# Patient Record
Sex: Female | Born: 1975
Health system: Southern US, Community
[De-identification: ages and names within clinical notes are randomized; demographics above are authoritative.]

## PROBLEM LIST (undated history)

## (undated) DIAGNOSIS — B009 Herpesviral infection, unspecified: Secondary | ICD-10-CM

## (undated) DIAGNOSIS — T7840XA Allergy, unspecified, initial encounter: Secondary | ICD-10-CM

## (undated) DIAGNOSIS — Z789 Other specified health status: Secondary | ICD-10-CM

## (undated) DIAGNOSIS — O209 Hemorrhage in early pregnancy, unspecified: Secondary | ICD-10-CM

## (undated) DIAGNOSIS — O09529 Supervision of elderly multigravida, unspecified trimester: Secondary | ICD-10-CM

## (undated) HISTORY — DX: Hemorrhage in early pregnancy, unspecified: O20.9

## (undated) HISTORY — DX: Supervision of elderly multigravida, unspecified trimester: O09.529

## (undated) HISTORY — PX: OTHER SURGICAL HISTORY: SHX169

## (undated) HISTORY — DX: Allergy, unspecified, initial encounter: T78.40XA

## (undated) HISTORY — DX: Herpesviral infection, unspecified: B00.9

---

## 1999-10-06 ENCOUNTER — Encounter: Admission: RE | Admit: 1999-10-06 | Discharge: 1999-10-06 | Payer: Self-pay | Admitting: Specialist

## 1999-10-06 ENCOUNTER — Encounter: Payer: Self-pay | Admitting: Specialist

## 2001-06-26 ENCOUNTER — Other Ambulatory Visit: Admission: RE | Admit: 2001-06-26 | Discharge: 2001-06-26 | Payer: Self-pay | Admitting: Obstetrics and Gynecology

## 2001-12-24 ENCOUNTER — Inpatient Hospital Stay (HOSPITAL_COMMUNITY): Admission: AD | Admit: 2001-12-24 | Discharge: 2001-12-24 | Payer: Self-pay | Admitting: Obstetrics and Gynecology

## 2001-12-24 ENCOUNTER — Inpatient Hospital Stay (HOSPITAL_COMMUNITY): Admission: AD | Admit: 2001-12-24 | Discharge: 2001-12-26 | Payer: Self-pay | Admitting: Obstetrics and Gynecology

## 2002-07-30 ENCOUNTER — Other Ambulatory Visit: Admission: RE | Admit: 2002-07-30 | Discharge: 2002-07-30 | Payer: Self-pay | Admitting: Obstetrics and Gynecology

## 2003-12-03 ENCOUNTER — Other Ambulatory Visit: Admission: RE | Admit: 2003-12-03 | Discharge: 2003-12-03 | Payer: Self-pay | Admitting: Obstetrics and Gynecology

## 2005-03-14 ENCOUNTER — Other Ambulatory Visit: Admission: RE | Admit: 2005-03-14 | Discharge: 2005-03-14 | Payer: Self-pay | Admitting: Obstetrics and Gynecology

## 2005-12-14 ENCOUNTER — Inpatient Hospital Stay (HOSPITAL_COMMUNITY): Admission: RE | Admit: 2005-12-14 | Discharge: 2005-12-17 | Payer: Self-pay | Admitting: Obstetrics and Gynecology

## 2006-01-29 ENCOUNTER — Other Ambulatory Visit: Admission: RE | Admit: 2006-01-29 | Discharge: 2006-01-29 | Payer: Self-pay | Admitting: Obstetrics and Gynecology

## 2008-10-29 ENCOUNTER — Encounter (INDEPENDENT_AMBULATORY_CARE_PROVIDER_SITE_OTHER): Payer: Self-pay | Admitting: Obstetrics and Gynecology

## 2008-10-29 ENCOUNTER — Ambulatory Visit (HOSPITAL_BASED_OUTPATIENT_CLINIC_OR_DEPARTMENT_OTHER): Admission: RE | Admit: 2008-10-29 | Discharge: 2008-10-29 | Payer: Self-pay | Admitting: Obstetrics and Gynecology

## 2010-08-28 LAB — CBC
HCT: 37.9 % (ref 36.0–46.0)
Hemoglobin: 13.1 g/dL (ref 12.0–15.0)
MCHC: 34.5 g/dL (ref 30.0–36.0)
MCV: 86.5 fL (ref 78.0–100.0)
Platelets: 166 10*3/uL (ref 150–400)
RBC: 4.38 MIL/uL (ref 3.87–5.11)
RDW: 13.1 % (ref 11.5–15.5)
WBC: 5.9 10*3/uL (ref 4.0–10.5)

## 2010-08-28 LAB — POCT PREGNANCY, URINE: Preg Test, Ur: NEGATIVE

## 2010-10-03 NOTE — Op Note (Signed)
[name], Julie Boyd       ACCOUNT NO.:  [id number]   MEDICAL RECORD NO.:  [id number]          PATIENT TYPE:  AMB   LOCATION:  [facility]                         FACILITY:  [facility]   PHYSICIAN:  Crist Fat. Rivard, M.D. DATE OF BIRTH:  Jun 14, 1975   DATE OF PROCEDURE:  10/29/2008  DATE OF DISCHARGE:                               OPERATIVE REPORT   PREOPERATIVE DIAGNOSIS:  Dysfunctional uterine bleeding with endometrial  polyps.   POSTOPERATIVE DIAGNOSIS:  Dysfunctional uterine bleeding with  endometrial polyps.   ANESTHESIA:  IV sedation, Dr. Council Mechanic; and paracervical block, Dr.  Estanislado Pandy.   PROCEDURE:  Hysteroscopy, resection of endometrial polyps, D and C.   SURGEON:  Sandra A. Rivard, M.D.   ASSISTANT:  No assistant.   ESTIMATED BLOOD LOSS:  Minimal.   PROCEDURE:  After being informed of the planned procedure with possible  complications including bleeding, infection and injury to uterus,  informed consent was obtained.  The patient is taken to OR #1, given IV  sedation without complication.  She is placed in a lithotomy position,  prepped and draped in a sterile fashion and her bladder is emptied with  an in-and-out red rubber catheter.  Pelvic exam reveals an anteverted  uterus normal in size and shape and 2 normal adnexa.  A weighted  speculum is inserted, the anterior lip of the cervix is grasped with a  tenaculum forceps, and we are unable to proceed with uterine sounding at  this time.  Using a cervical os finder, we easily find the right the  direction and we are able to easily dilate the cervix using Hegar  dilator until #33.  The uterus is then sounded at 8.5 cm.   Using an operative hysteroscope with perfusion of sorbitol at a maximum  pressure of 70 mmHg, we are able to visualize the entire uterine cavity  with both tubal ostia.  We note 2 fundal polyps on the posterior wall  and 1 posterior wall lower uterine segment polyp; all of these are  easily resected using  resectoscope.  The hysteroscope is then removed.  A sharp curette is used to curette the endometrial cavity to remove the  rest of normal-appearing endometrium.  The hysteroscope is reinserted to  visualize a clean cavity and no active bleeding.   Instruments are then removed.  Instrument and sponge count is complete  x2.  Estimated blood loss is minimal.  A laceration on the cervix due to  the tenaculum forceps is repaired with a figure-of-eight stitch of 3-0  Vicryl.   Water deficit is 200 mL.  The procedure is very well tolerated by the  patient, who is taken to the recovery room and discharged home in a well  and stable condition.   SPECIMEN:  Endometrial polyps and endometrial curettings sent to  pathology.      Crist Fat Rivard, M.D.  Electronically Signed     SAR/MEDQ  D:  10/29/2008  T:  10/29/2008  Job:  [id number]

## 2010-10-06 NOTE — H&P (Signed)
NAMESOMMER, SPICKARD       ACCOUNT NO.:  [id number]   MEDICAL RECORD NO.:  [id number]          PATIENT TYPE:  MAT   LOCATION:  [facility]                          FACILITY:  [facility]   PHYSICIAN:  Janine Limbo, M.D.DATE OF BIRTH:  Mar 27, 1976   DATE OF ADMISSION:  12/14/2005  DATE OF DISCHARGE:                                HISTORY & PHYSICAL   This is a 35 year old gravida 2, para 1-0-0-1 at 65 and 6/7 weeks who  presented with breech presentation of her infant today and had an external  version which was successful.  She is now being admitted for induction of  labor secondary to unstable lie.  The pregnancy has been followed by Dr.  Estanislado Pandy and remarkable for:  1.  Unsure dates.  2.  History of PPD positive which was treated.  3.  History of thrombocytopenia.  4.  Group B strep negative.   ALLERGIES:  ALEVE CAUSES A RASH.   OB HISTORY:  Remarkable for a vaginal delivery in 2003 of a female infant at  [redacted] weeks gestation weighing 8 pounds, 8 ounces with no complications.   PAST MEDICAL HISTORY:  Remarkable for varicella in 1999, history of  tuberculosis PPD positive in 1995 which was treated, and hypoglycemia in  2002.   FAMILY HISTORY:  Remarkable for grandmother with hypertension.  Mother with  TB, brother with seizures, and mother with migraines.   PAST SURGICAL HISTORY:  Negative.   GENETIC HISTORY:  Negative.   SOCIAL HISTORY:  The patient is married to [place] who is involved and  supportive.  She is of the [employer].  She denies any alcohol, tobacco,  or drug use.  She works as an Equities trader.   PRENATAL LABS:  Blood type A positive, sickle cell trait negative, HIV  declined, Pap normal, hepatitis negative, RPR nonreactive, rubella immune,  cystic fibrosis negative.  Platelets 165,000, most recent platelets are  140,000 in July.   HISTORY OF CURRENT PREGNANCY:  The patient entered care at [redacted] weeks  gestation.  She had an ultrasound to confirm date.  She  had an ultrasound  again at 20 weeks for anatomy which was normal.  She had Glucola at 27 weeks  which was normal.  She had a group B strep done at 36 weeks and it was  negative.  Ultrasound at 37 weeks showed transverse presentation with weight  in the 92nd percentile and bilateral hydrocele.   OBJECTIVE:  VITAL SIGNS:  Stable, afebrile.  HEENT:  Within normal limits.  NECK:  Thyroid normal, not enlarged.  CHEST:  Clear to auscultation.  HEART:  Regular rate and rhythm.  ABDOMEN:  Gravid, 38 cm, vertex, Leopold's, EFM shows reactive fetal heart  rate with irregular contractions.  PELVIC:  Cervix is 1 long, -3 in vertex presentation.  EXTREMITIES:  Within normal limits.   IMPRESSION:  1.  Intrauterine pregnancy at 37 and 6/7 weeks.  2.  Breech presentation, now vertex after successful version.  3.  Unstable lie.   PLAN:  Admit to birthing suites for induction of labor per Dr. Stefano Gaul.      Marie L. Williams, C.N.M.  Janine Limbo, M.D.  Electronically Signed    MLW/MEDQ  D:  12/14/2005  T:  12/14/2005  Job:  [id number]

## 2010-10-06 NOTE — H&P (Signed)
Julie Boyd, Julie Boyd                   ACCOUNT NO.:  [id number]   MEDICAL RECORD NO.:  [id number]                   PATIENT TYPE:  INP   LOCATION:  9127                                 [facility]ACILITY:  [facility]   PHYSICIAN:  Hal Morales, M.D.             DATE OF BIRTH:  [birth year]   DATE OF ADMISSION:  12/24/2001  DATE OF DISCHARGE:  12/26/2001                                HISTORY & PHYSICAL   HISTORY:  The patient is a 35 year old married Hispanic female primigravida  at 39-4/7 weeks who presents with regular uterine contractions all day that  have become increasingly stronger.  She had a labor evaluation in maternity  admissions earlier this afternoon with her cervical examination at that time  3 cm, 90% effaced.  She denies leaking, bleeding, headache, nausea,  vomiting, or visual disturbances.  She reports positive fetal movement.  Her  pregnancy has been followed by the [facility] M.D. service and  has been remarkable for history of positive PPD treated for six months,  thrombocytopenia, group B Strep negative.  Her prenatal laboratories were  collected on June 26, 2001.  Hemoglobin 12.3, hematocrit 35.7, platelets  117,000.  Blood type A+.  Antibody negative.  Sickle cell trait negative.  RPR nonreactive.  Rubella immune.  Hepatitis B surface antigen negative.  HIV nonreactive.  Pap smear was within normal limits.  Gonorrhea negative.  Chlamydia negative.  On July 10, 2001 maternal serum alpha fetoprotein  was within normal range.  On Oct 03, 2001 her one hour Glucola was 102 and  her hemoglobin at that time was 10.9.  Culture of the vaginal tract for  group B Strep on November 27, 2001 was negative.   HISTORY OF PRESENT PREGNANCY:  The patient presented for care on June 26, 2001 at approximately [redacted] weeks gestation.  She was noted to have  thrombocytopenia at that visit and plans were made to recheck her CBC  throughout the pregnancy.  Pregnancy  ultrasonography at [redacted] weeks gestation  showed growth consistent with dates by previous ultrasound giving her an [facility]  of December 27, 2001.  A repeat ultrasound was recommended to be done at 26  weeks to view three vessel cord as well as the insertion site.  A repeat CBC  from Oct 03, 2001 showed hemoglobin 11.2 with platelets 148,000.  Pregnancy  ultrasonography at [redacted] weeks gestation showed normal fluid and normal growth.  At 34-1/[redacted] weeks gestation she had +1 blood in clean catch specimen and trace  protein, 3+ blood in a catheterized specimen.  Her urine was sent for  culture.  That culture was negative and there was no pain.  Creatinine was  normal.  Her hematuria was unexplained.  At her visit at 36-1/2 weeks she  had proteinuria as well as a 24-hour urine and [facility] laboratories were  performed at that time, but are unavailable at this point.   OBSTETRIC HISTORY:  She is  a primigravida.   ALLERGIES:  ALEVE which she gets a rash from.   PAST MEDICAL HISTORY:  She reports having had the usual childhood illnesses.  She had a positive PPD in 1996 with prophylaxis for six months.  She has a  history of constipation.  At the age of 7 she was unconscious after a fall,  but reports no other major accidents.   PAST SURGICAL HISTORY:  Negative.   FAMILY HISTORY:  Remarkable for a paternal grandmother with chronic  hypertension, mother with positive PPD in 1996 and treated for six months as  well.  She has a cousin with history of asthma.  Maternal grandfather  deceased from cancer.  Brother with a history of seizures.  Mother with  history of migraines.  Some family members with alcohol use.   GENETIC HISTORY:  Remarkable for patient's first cousin with septal defects.   SOCIAL HISTORY:  The patient is married to the father of the baby.  His name  is Deatra Ina.  They are of the Catholic faith and Hispanic ethnicity.  They  deny any alcohol, tobacco, or illicit drug use with the pregnancy.    PHYSICAL EXAMINATION:  VITAL SIGNS:  Stable.  She is afebrile.  HEENT:  Grossly within normal limits.  CHEST:  Clear to auscultation.  HEART:  Regular rate and rhythm.  ABDOMEN:  Gravid in contour.  Fundal height extending approximately 39 cm  above the pubic symphysis.  Electronic fetal monitoring is remarkable for  reactive, reassuring fetal heart rate with uterine contractions every two to  four minutes.  PELVIC:  Cervical examination is 4-5 cm, completely effaced, vertex -1 with  intact bag of water.  EXTREMITIES:  Within normal limits.   ASSESSMENT:  1. Intrauterine pregnancy at term.  2. Active labor.  3. Group B Strep negative.   PLAN:  1. Admit __________ for consult with Dr. Pennie Rushing.  2. Routine M.D. orders.  3. Anticipate normal spontaneous vaginal birth.     Cam Hai, CNM                        Hal Morales, M.D.    KS/MEDQ  D:  12/24/2001  T:  12/26/2001  Job:  [id number]

## 2010-10-06 NOTE — Op Note (Signed)
[name], Julie Boyd       ACCOUNT NO.:  [id number]   MEDICAL RECORD NO.:  [id number]          PATIENT TYPE:  MAT   LOCATION:  [facility]                          [facility]   PHYSICIAN:  Janine Limbo, M.D.DATE OF BIRTH:  Aug 22, 1975   DATE OF PROCEDURE:  12/14/2005  DATE OF DISCHARGE:                                 OPERATIVE REPORT   PREOPERATIVE DIAGNOSES:  1.  38 weeks' gestation.  2.  Breech presentation.   POSTOPERATIVE DIAGNOSES:  1.  38 weeks' gestation.  2.  Breech presentation.   PROCEDURE:  Successful external version.   OBSTETRICIAN:  Janine Limbo, M.D.   FIRST ASSISTANT:  Wynelle Bourgeois, CNM.   ANESTHESIA:  None.   DISPOSITION:  The patient is a 35 year old female, gravida 2, para 1-0-0-1,  who presents at 31 weeks' gestation.  She had an infant in the  breech  presentation.  She understands the indications for her procedure and she  accepts the risks of possible fetal distress and possible cesarean delivery.   FINDINGS:  An ultrasound was performed.  The patient was found to have a  single intrauterine gestation in a breech presentation.  The fetal head was  in the right upper quadrant.  The amniotic fluid volume was normal.  There  was an anterior placenta that was grade 2.  The  preprocedure nonstress test was reactive.  The patient's blood type is A+.   PROCEDURE:  The patient was given subcutaneous terbutaline.  The nonstress  test was confirmed to be reactive.  An ultrasound was performed, with  findings as mentioned above.  The patient's abdomen was lubricated.  Under  ultrasound surveillance, we then attempted a counterclockwise external  version.  The fetal head was noted to rotate to the right transverse  position.  Several other attempts were made to rotate the fetal head, and we  were able to successfully rotate the fetal head to a vertex presentation.  The cervix was examined and she was noted to be 1 cm dilated, long, and -3  in  station.  A nonstress test was repeated and the nonstress test was noted  to be reactive.   FOLLOW-UP PLANS:  The patient was offered induction of labor and she was  offered discharge to home.  She carefully considered those options and  elected to remain in the hospital for Cytotec preparation and Pitocin  augmentation of labor.      Janine Limbo, M.D.  Electronically Signed     AVS/MEDQ  D:  12/14/2005  T:  12/14/2005  Job:  [id number]

## 2011-07-31 ENCOUNTER — Ambulatory Visit: Payer: Self-pay | Admitting: Obstetrics and Gynecology

## 2011-08-15 ENCOUNTER — Ambulatory Visit (INDEPENDENT_AMBULATORY_CARE_PROVIDER_SITE_OTHER): Payer: Self-pay | Admitting: Obstetrics and Gynecology

## 2011-08-15 DIAGNOSIS — Z01419 Encounter for gynecological examination (general) (routine) without abnormal findings: Secondary | ICD-10-CM

## 2011-09-17 ENCOUNTER — Telehealth: Payer: Self-pay | Admitting: Obstetrics and Gynecology

## 2011-09-17 NOTE — Telephone Encounter (Signed)
Routed to triage 

## 2011-09-18 NOTE — Telephone Encounter (Signed)
This sr pt

## 2011-09-18 NOTE — Telephone Encounter (Signed)
Pt given pap results and directions for Ibuprofen.  600 mg qid during cycle per SR(chart).  ld

## 2012-04-25 ENCOUNTER — Telehealth: Payer: Self-pay

## 2012-04-25 NOTE — Telephone Encounter (Signed)
Pt walked in this am and left a msg for SR regarding her periods being longer than ususal.  Pt contacted and notified that she would need to be seen. appt sch for 05-07-12. Pt agreeable.  ld

## 2012-05-07 ENCOUNTER — Encounter: Payer: Self-pay | Admitting: Obstetrics and Gynecology

## 2012-09-25 LAB — HM PAP SMEAR: HM PAP: NORMAL

## 2013-05-21 NOTE — L&D Delivery Note (Signed)
Delivery Note At 11:30 AM a viable female, as yet unnamed, was delivered via Vaginal, Spontaneous Delivery (Presentation: Left Occiput Anterior).  APGAR: 8, 9; weight .   Placenta status: spontaneous, intact.  Cord: 3 vessels with the following complications: None.  Cord pH: NA  Anesthesia: None  Episiotomy: None Lacerations: None Suture Repair: None Est. Blood Loss (mL): 200  Mom to postpartum.  Baby to Couplet care / Skin to Skin. Family declines circumcision.  Slight softness of uterus persisted during immediate pp recovery.  Firmed well with massage. Methergine 0.2 mg QID x 24 hours initiated.  Julie Boyd, Julie Boyd 11/29/2013, 12:02 PM

## 2013-11-18 LAB — OB RESULTS CONSOLE GBS: GBS: NEGATIVE

## 2013-11-18 LAB — OB RESULTS CONSOLE ANTIBODY SCREEN: Antibody Screen: NEGATIVE

## 2013-11-18 LAB — OB RESULTS CONSOLE GC/CHLAMYDIA
Chlamydia: NEGATIVE
Gonorrhea: NEGATIVE

## 2013-11-18 LAB — OB RESULTS CONSOLE RPR: RPR: NONREACTIVE

## 2013-11-18 LAB — OB RESULTS CONSOLE ABO/RH: RH TYPE: POSITIVE

## 2013-11-18 LAB — OB RESULTS CONSOLE HEPATITIS B SURFACE ANTIGEN: Hepatitis B Surface Ag: NEGATIVE

## 2013-11-18 LAB — OB RESULTS CONSOLE HIV ANTIBODY (ROUTINE TESTING): HIV: NONREACTIVE

## 2013-11-18 LAB — OB RESULTS CONSOLE RUBELLA ANTIBODY, IGM: RUBELLA: IMMUNE

## 2013-11-29 ENCOUNTER — Encounter (HOSPITAL_COMMUNITY): Payer: Self-pay | Admitting: *Deleted

## 2013-11-29 ENCOUNTER — Inpatient Hospital Stay (HOSPITAL_COMMUNITY)
Admission: AD | Admit: 2013-11-29 | Discharge: 2013-11-30 | DRG: 813 | Disposition: A | Discharge status: Home / Self Care | Payer: BC Managed Care – PPO | Source: Ambulatory Visit | Attending: Obstetrics and Gynecology | Admitting: Obstetrics and Gynecology

## 2013-11-29 DIAGNOSIS — O322XX0 Maternal care for transverse and oblique lie, not applicable or unspecified: Secondary | ICD-10-CM | POA: Diagnosis present

## 2013-11-29 DIAGNOSIS — O479 False labor, unspecified: Secondary | ICD-10-CM | POA: Diagnosis present

## 2013-11-29 DIAGNOSIS — O9912 Other diseases of the blood and blood-forming organs and certain disorders involving the immune mechanism complicating childbirth: Principal | ICD-10-CM

## 2013-11-29 DIAGNOSIS — O209 Hemorrhage in early pregnancy, unspecified: Secondary | ICD-10-CM

## 2013-11-29 DIAGNOSIS — D696 Thrombocytopenia, unspecified: Secondary | ICD-10-CM | POA: Diagnosis present

## 2013-11-29 DIAGNOSIS — O09529 Supervision of elderly multigravida, unspecified trimester: Secondary | ICD-10-CM | POA: Diagnosis present

## 2013-11-29 DIAGNOSIS — Z833 Family history of diabetes mellitus: Secondary | ICD-10-CM | POA: Diagnosis not present

## 2013-11-29 DIAGNOSIS — D689 Coagulation defect, unspecified: Secondary | ICD-10-CM | POA: Diagnosis present

## 2013-11-29 HISTORY — DX: Other specified health status: Z78.9

## 2013-11-29 HISTORY — DX: Supervision of elderly multigravida, unspecified trimester: O09.529

## 2013-11-29 HISTORY — DX: Hemorrhage in early pregnancy, unspecified: O20.9

## 2013-11-29 LAB — CBC
HCT: 36.9 % (ref 36.0–46.0)
Hemoglobin: 12.9 g/dL (ref 12.0–15.0)
MCH: 30.4 pg (ref 26.0–34.0)
MCHC: 35 g/dL (ref 30.0–36.0)
MCV: 86.8 fL (ref 78.0–100.0)
Platelets: 115 10*3/uL — ABNORMAL LOW (ref 150–400)
RBC: 4.25 MIL/uL (ref 3.87–5.11)
RDW: 15.1 % (ref 11.5–15.5)
WBC: 15.4 10*3/uL — ABNORMAL HIGH (ref 4.0–10.5)

## 2013-11-29 LAB — TYPE AND SCREEN
ABO/RH(D): A POS
ANTIBODY SCREEN: NEGATIVE

## 2013-11-29 LAB — RPR

## 2013-11-29 LAB — ABO/RH: ABO/RH(D): A POS

## 2013-11-29 MED ORDER — DIPHENHYDRAMINE HCL 50 MG/ML IJ SOLN: 12.5000 mg | INTRAMUSCULAR | Status: DC | PRN

## 2013-11-29 MED ORDER — WITCH HAZEL-GLYCERIN EX PADS: 1.0000 "application " | MEDICATED_PAD | CUTANEOUS | Status: DC | PRN

## 2013-11-29 MED ORDER — ZOLPIDEM TARTRATE 5 MG PO TABS: 5.0000 mg | ORAL_TABLET | Freq: Every evening | ORAL | Status: DC | PRN

## 2013-11-29 MED ORDER — BENZOCAINE-MENTHOL 20-0.5 % EX AERO: 1.0000 "application " | INHALATION_SPRAY | CUTANEOUS | Status: DC | PRN

## 2013-11-29 MED ORDER — SENNOSIDES-DOCUSATE SODIUM 8.6-50 MG PO TABS
2.0000 | ORAL_TABLET | ORAL | Status: DC
  Administered 2013-11-29: 2 via ORAL
  Filled 2013-11-29: qty 2

## 2013-11-29 MED ORDER — ONDANSETRON HCL 4 MG/2ML IJ SOLN: 4.0000 mg | Freq: Four times a day (QID) | INTRAMUSCULAR | Status: DC | PRN

## 2013-11-29 MED ORDER — FLEET ENEMA 7-19 GM/118ML RE ENEM: 1.0000 | ENEMA | RECTAL | Status: DC | PRN

## 2013-11-29 MED ORDER — PHENYLEPHRINE 40 MCG/ML (10ML) SYRINGE FOR IV PUSH (FOR BLOOD PRESSURE SUPPORT)
80.0000 ug | PREFILLED_SYRINGE | INTRAVENOUS | Status: DC | PRN
  Filled 2013-11-29: qty 2
  Filled 2013-11-29: qty 10

## 2013-11-29 MED ORDER — BUTORPHANOL TARTRATE 1 MG/ML IJ SOLN
INTRAMUSCULAR | Status: AC
  Administered 2013-11-29: 1 mg via INTRAVENOUS
  Filled 2013-11-29: qty 1

## 2013-11-29 MED ORDER — ONDANSETRON HCL 4 MG PO TABS: 4.0000 mg | ORAL_TABLET | ORAL | Status: DC | PRN

## 2013-11-29 MED ORDER — OXYCODONE-ACETAMINOPHEN 5-325 MG PO TABS: 1.0000 | ORAL_TABLET | ORAL | Status: DC | PRN

## 2013-11-29 MED ORDER — OXYTOCIN BOLUS FROM INFUSION: 500.0000 mL | INTRAVENOUS | Status: DC

## 2013-11-29 MED ORDER — TETANUS-DIPHTH-ACELL PERTUSSIS 5-2.5-18.5 LF-MCG/0.5 IM SUSP: 0.5000 mL | Freq: Once | INTRAMUSCULAR | Status: DC

## 2013-11-29 MED ORDER — DIPHENHYDRAMINE HCL 25 MG PO CAPS: 25.0000 mg | ORAL_CAPSULE | Freq: Four times a day (QID) | ORAL | Status: DC | PRN

## 2013-11-29 MED ORDER — IBUPROFEN 600 MG PO TABS: 600.0000 mg | ORAL_TABLET | Freq: Four times a day (QID) | ORAL | Status: DC | PRN

## 2013-11-29 MED ORDER — BUTORPHANOL TARTRATE 1 MG/ML IJ SOLN
1.0000 mg | INTRAMUSCULAR | Status: DC | PRN
  Administered 2013-11-29: 1 mg via INTRAVENOUS

## 2013-11-29 MED ORDER — METHYLERGONOVINE MALEATE 0.2 MG PO TABS
0.2000 mg | ORAL_TABLET | Freq: Four times a day (QID) | ORAL | Status: DC
  Administered 2013-11-29 – 2013-11-30 (×4): 0.2 mg via ORAL
  Filled 2013-11-29 (×4): qty 1

## 2013-11-29 MED ORDER — PRENATAL MULTIVITAMIN CH
1.0000 | ORAL_TABLET | Freq: Every day | ORAL | Status: DC
  Administered 2013-11-30: 1 via ORAL
  Filled 2013-11-29 (×2): qty 1

## 2013-11-29 MED ORDER — ONDANSETRON HCL 4 MG/2ML IJ SOLN: 4.0000 mg | INTRAMUSCULAR | Status: DC | PRN

## 2013-11-29 MED ORDER — LACTATED RINGERS IV SOLN: 500.0000 mL | Freq: Once | INTRAVENOUS | Status: DC

## 2013-11-29 MED ORDER — IBUPROFEN 600 MG PO TABS
600.0000 mg | ORAL_TABLET | Freq: Four times a day (QID) | ORAL | Status: DC
  Administered 2013-11-29 – 2013-11-30 (×4): 600 mg via ORAL
  Filled 2013-11-29 (×4): qty 1

## 2013-11-29 MED ORDER — EPHEDRINE 5 MG/ML INJ
10.0000 mg | INTRAVENOUS | Status: DC | PRN
  Filled 2013-11-29: qty 2

## 2013-11-29 MED ORDER — LANOLIN HYDROUS EX OINT: TOPICAL_OINTMENT | CUTANEOUS | Status: DC | PRN

## 2013-11-29 MED ORDER — ACETAMINOPHEN 325 MG PO TABS: 650.0000 mg | ORAL_TABLET | ORAL | Status: DC | PRN

## 2013-11-29 MED ORDER — LIDOCAINE HCL (PF) 1 % IJ SOLN
30.0000 mL | INTRAMUSCULAR | Status: DC | PRN
  Filled 2013-11-29: qty 30

## 2013-11-29 MED ORDER — PHENYLEPHRINE 40 MCG/ML (10ML) SYRINGE FOR IV PUSH (FOR BLOOD PRESSURE SUPPORT)
80.0000 ug | PREFILLED_SYRINGE | INTRAVENOUS | Status: DC | PRN
  Filled 2013-11-29: qty 2

## 2013-11-29 MED ORDER — OXYTOCIN 40 UNITS IN LACTATED RINGERS INFUSION - SIMPLE MED
62.5000 mL/h | INTRAVENOUS | Status: DC
  Administered 2013-11-29: 62.5 mL/h via INTRAVENOUS
  Filled 2013-11-29: qty 1000

## 2013-11-29 MED ORDER — LACTATED RINGERS IV SOLN: 500.0000 mL | INTRAVENOUS | Status: DC | PRN

## 2013-11-29 MED ORDER — DIBUCAINE 1 % RE OINT: 1.0000 "application " | TOPICAL_OINTMENT | RECTAL | Status: DC | PRN

## 2013-11-29 MED ORDER — OXYCODONE-ACETAMINOPHEN 5-325 MG PO TABS
1.0000 | ORAL_TABLET | ORAL | Status: DC | PRN
  Filled 2013-11-29: qty 2

## 2013-11-29 MED ORDER — CITRIC ACID-SODIUM CITRATE 334-500 MG/5ML PO SOLN: 30.0000 mL | ORAL | Status: DC | PRN

## 2013-11-29 MED ORDER — FENTANYL 2.5 MCG/ML BUPIVACAINE 1/10 % EPIDURAL INFUSION (WH - ANES): 14.0000 mL/h | INTRAMUSCULAR | Status: DC | PRN

## 2013-11-29 MED ORDER — LACTATED RINGERS IV SOLN
INTRAVENOUS | Status: DC
  Administered 2013-11-29: 11:00:00 via INTRAVENOUS

## 2013-11-29 MED ORDER — SIMETHICONE 80 MG PO CHEW: 80.0000 mg | CHEWABLE_TABLET | ORAL | Status: DC | PRN

## 2013-11-29 NOTE — Lactation Note (Signed)
This note was copied from the chart of Julie Boyd. Lactation Consultation Note      Initial consult with this experienced breast feeding mom and her baby, now 3 hours old, and [redacted] weeks gestation. Mom had me look at the under side of her right breast, where she has patches of dried poison ivy. Since the area is dry and healing, I told her it would be safe to breast feed on this side. Mom was going to do breast and formula, but when I explained that cluster feeding is natures was of bring in her milk, and how not introducing formula was better for the baby, mom said she will not use formula. I told her i would prefer to use EBM if she felt the baby was hungry, and to please call for lactation if she has any concerns or questions about this. [place] feeding pages in the baby and Me book reievwed with mom, as well as lactation community and   Patient Name: Julie Boyd ZOXWR'UToday's Date: 11/29/2013 Reason for consult: Initial assessment   Maternal Data Formula Feeding for Exclusion: Yes Reason for exclusion: Mother's choice to formula and breast feed on admission Infant to breast within first hour of birth: Yes Has patient been taught Hand Expression?: Yes Does the patient have breastfeeding experience prior to this delivery?: Yes  Feeding Feeding Type: Breast Fed Length of feed: 20 min  LATCH Score/Interventions Latch: Grasps breast easily, tongue down, lips flanged, rhythmical sucking.  Audible Swallowing: None Intervention(s): Skin to skin  Type of Nipple: Everted at rest and after stimulation  Comfort (Breast/Nipple): Soft / non-tender     Hold (Positioning): No assistance needed to correctly position infant at breast.  LATCH Score: 8  Lactation Tools Discussed/Used     Consult Status Consult Status: Follow-up Date: 11/30/13 Follow-up type: In-patient    Julie LevinsLee, Smt. Loder Anne 11/29/2013, 3:00 PM

## 2013-11-29 NOTE — MAU Note (Signed)
Contractions started early this morning, getting stronger.  ? Pinkish fluid.  3rd preg, this has been different. Was 1cm when last checked

## 2013-11-29 NOTE — H&P (Signed)
Julie Boyd is a 38 y.o. female, G3P2002 at 31 weeks, presenting for UCs since 5:30 am, with ? Leaking of pink fluid.  Reports + FM, denies active bleeding.  Cervix was 1 cm and thick at exam this week, with fetus in a vtx/oblique lie on [place].    Patient Active Problem List   Diagnosis Date Noted  . First trimester bleeding 11/29/2013  . Elderly multigravida 11/29/2013  . Thrombocytopenia, unspecified 11/29/2013    History of present pregnancy: Patient entered care at 11 1/7 weeks.   EDC of 12/13/13 was established by LMP and in agreement with [place] at 7 weeks.   Anatomy scan:  19 2/7 weeks, with normal findings and an anterior placenta.   Additional [place] evaluations:   37 weeks, for presentation--vtx/oblique, normal fluid  Significant prenatal events:  Initially declined genetic testing, then elected to have Harmony and AFP (WNL).  Received TDAP at 27 4/7 weeks.  Had sx of carpal tunnel sx, referred to hand specialist.  Had + urine culture of 60K Ecoli and VRE, negative on repeat.  Vit D and TSH were checked during pregnancy and were WNL.  Platelet count was 165 at NOB, 136 in April, 131 in May, and 125 on last check on 11/18/13. Last evaluation:  11/25/13, with cervix 1 cm, thick, vtx/oblique.  OB History   Grav Para Term Preterm Abortions TAB SAB Ect Mult Living   3 2 2       2     2003--[facility], 72 hour labor, 40 weeks, female, 8+7, epidural, delivered with [facility] 2007--[facility], 72 hour labor, 40 weeks, female, 8+15, epidural, delivered with [facility]  Past Medical History  Diagnosis Date  . Medical history non-contributory    Past Surgical History  Procedure Laterality Date  . Polyp removal     Family History: Father DM; Son asthma; MU colon Ca; PA breast CA  Social History:  reports that she has never smoked. She has never used smokeless tobacco. She reports that she does not drink alcohol or use illicit drugs. FOB, Julie Boyd, is involved and supportive.  Patient is self-employed, of  [employer] faith, and is of Hispanic ethnicity.   Prenatal Transfer Tool  Maternal Diabetes: No Genetic Screening: Normal Harmony and AFP Maternal Ultrasounds/Referrals: Normal Fetal Ultrasounds or other Referrals:  None Maternal Substance Abuse:  No Significant Maternal Medications:  None Significant Maternal Lab Results: Lab values include: Group B Strep negative    ROS:  Contractions, ? Leaking, +FM  Allergies  Allergen Reactions  . Aleve [Naproxen] Rash     Dilation: 4 (Fluid noted confirmig ROM) Effacement (%): 80 Station: -1;0 Exam by:: Julie Boyd, CNM Blood pressure 115/72, pulse 67, temperature 98 F (36.7 C), temperature source Oral, resp. rate 20, height 5' 0.5" (1.537 m), weight 180 lb (81.647 kg), last menstrual period 03/08/2013.  Chest clear Heart RRR without murmur Abd gravid, NT, FH 39 Pelvic: As above--leaking clear fluid, vtx low in pelvis, but slightly oblique Ext: WNL  FHR: Category 1 UCs:  q 3 min, moderate  Prenatal labs: ABO, Rh:   A+ Antibody:  Neg Rubella:   Immune RPR:   NR HBsAg:   Neg HIV:   NR GBS: Negative (07/01 0000) Sickle cell/Hgb electrophoresis:  NA Pap:  WNL 09/2012 GC:  Negative at NOB and 11/18/13 Chlamydia:  Negative at NOB at 11/18/13 Genetic screenings:  Normlal Harmony and AFP Glucola:  WNL Other:   Platelets:  165 at NOB, 136 on 4/30, 131 on 5/12,  125 on 7/1       Assessment/Plan: IUP at 38 weeks SROM, early labor GBS negative Hx mild thrombocytopenia Slightly oblique lie  Plan: Admit to Birthing Suite per consult with Julie Boyd Routine [facility] orders Plans epidural, if platelet count allows  Julie Boyd, [name], [place] 11/29/2013, 10:19 AM

## 2013-11-29 NOTE — MAU Note (Signed)
Patient presents with complaint of contractions every 3-5 minutes since 0533 today.

## 2013-11-30 LAB — CBC
HCT: 38 % (ref 36.0–46.0)
Hemoglobin: 13.1 g/dL (ref 12.0–15.0)
MCH: 30.4 pg (ref 26.0–34.0)
MCHC: 34.5 g/dL (ref 30.0–36.0)
MCV: 88.2 fL (ref 78.0–100.0)
Platelets: 101 10*3/uL — ABNORMAL LOW (ref 150–400)
RBC: 4.31 MIL/uL (ref 3.87–5.11)
RDW: 15.2 % (ref 11.5–15.5)
WBC: 13.2 10*3/uL — AB (ref 4.0–10.5)

## 2013-11-30 MED ORDER — OXYCODONE-ACETAMINOPHEN 5-325 MG PO TABS: 1.0000 | ORAL_TABLET | Freq: Four times a day (QID) | ORAL | Status: DC | PRN

## 2013-11-30 NOTE — Progress Notes (Signed)
Ur chart review completed.  

## 2013-11-30 NOTE — Lactation Note (Signed)
This note was copied from the chart of Julie Boyd. Lactation Consultation Note  Mother denies problems or soreness.  LS9. Mom encouraged to feed baby 8-12 times/24 hours and with feeding cues.  Reviewed engorgement care and encouraged mother to call with questions.  Patient Name: Julie Boyd [id number]'XToday's Date: 11/30/2013 Reason for consult: Follow-up assessment   Maternal Data    Feeding Feeding Type: Breast Fed Length of feed: 25 min  LATCH Score/Interventions                      Lactation Tools Discussed/Used     Consult Status Consult Status: Complete    Hardie PulleyBerkelhammer, Ruth Boschen 11/30/2013, 1:28 PM

## 2013-11-30 NOTE — Discharge Instructions (Signed)
Anemia, Nonspecific Anemia is a condition in which the concentration of red blood cells or hemoglobin in the blood is below normal. Hemoglobin is a substance in red blood cells that carries oxygen to the tissues of the body. Anemia results in not enough oxygen reaching these tissues.  CAUSES  Common causes of anemia include:   Excessive bleeding. Bleeding may be internal or external. This includes excessive bleeding from periods (in women) or from the intestine.   Poor nutrition.   Chronic kidney, thyroid, and liver disease.  Bone marrow disorders that decrease red blood cell production.  Cancer and treatments for cancer.  HIV, AIDS, and their treatments.  Spleen problems that increase red blood cell destruction.  Blood disorders.  Excess destruction of red blood cells due to infection, medicines, and autoimmune disorders. SIGNS AND SYMPTOMS   Minor weakness.   Dizziness.   Headache.  Palpitations.   Shortness of breath, especially with exercise.   Paleness.  Cold sensitivity.  Indigestion.  Nausea.  Difficulty sleeping.  Difficulty concentrating. Symptoms may occur suddenly or they may develop slowly.  DIAGNOSIS  Additional blood tests are often needed. These help your health care provider determine the best treatment. Your health care provider will check your stool for blood and look for other causes of blood loss.  TREATMENT  Treatment varies depending on the cause of the anemia. Treatment can include:   Supplements of iron, vitamin B12, or folic acid.   Hormone medicines.   A blood transfusion. This may be needed if blood loss is severe.   Hospitalization. This may be needed if there is significant continual blood loss.   Dietary changes.  Spleen removal. HOME CARE INSTRUCTIONS Keep all follow-up appointments. It often takes many weeks to correct anemia, and having your health care provider check on your condition and your response to  treatment is very important. SEEK IMMEDIATE MEDICAL CARE IF:   You develop extreme weakness, shortness of breath, or chest pain.   You become dizzy or have trouble concentrating.  You develop heavy vaginal bleeding.   You develop a rash.   You have bloody or black, tarry stools.   You faint.   You vomit up blood.   You vomit repeatedly.   You have abdominal pain.  You have a fever or persistent symptoms for more than 2-3 days.   You have a fever and your symptoms suddenly get worse.   You are dehydrated.  MAKE SURE YOU:  Understand these instructions.  Will watch your condition.  Will get help right away if you are not doing well or get worse. Document Released: 06/14/2004 Document Revised: 01/07/2013 Document Reviewed: 10/31/2012 [facility] Patient Information 2015 [place], [place]. This information is not intended to replace advice given to you by your health care provider. Make sure you discuss any questions you have with your health care provider. Contraception Choices Contraception (birth control) is the use of any methods or devices to prevent pregnancy. Below are some methods to help avoid pregnancy. HORMONAL METHODS   Contraceptive implant. This is a thin, plastic tube containing progesterone hormone. It does not contain estrogen hormone. Your health care provider inserts the tube in the inner part of the upper arm. The tube can remain in place for up to 3 years. After 3 years, the implant must be removed. The implant prevents the ovaries from releasing an egg (ovulation), thickens the cervical mucus to prevent sperm from entering the uterus, and thins the lining of the inside of the  uterus.  Progesterone-only injections. These injections are given every 3 months by your health care provider to prevent pregnancy. This synthetic progesterone hormone stops the ovaries from releasing eggs. It also thickens cervical mucus and changes the uterine lining. This makes  it harder for sperm to survive in the uterus.  Birth control pills. These pills contain estrogen and progesterone hormone. They work by preventing the ovaries from releasing eggs (ovulation). They also cause the cervical mucus to thicken, preventing the sperm from entering the uterus. Birth control pills are prescribed by a health care provider.Birth control pills can also be used to treat heavy periods.  Minipill. This type of birth control pill contains only the progesterone hormone. They are taken every day of each month and must be prescribed by your health care provider.  Birth control patch. The patch contains hormones similar to those in birth control pills. It must be changed once a week and is prescribed by a health care provider.  Vaginal ring. The ring contains hormones similar to those in birth control pills. It is left in the vagina for 3 weeks, removed for 1 week, and then a new one is put back in place. The patient must be comfortable inserting and removing the ring from the vagina.A health care provider's prescription is necessary.  Emergency contraception. Emergency contraceptives prevent pregnancy after unprotected sexual intercourse. This pill can be taken right after sex or up to 5 days after unprotected sex. It is most effective the sooner you take the pills after having sexual intercourse. Most emergency contraceptive pills are available without a prescription. Check with your pharmacist. Do not use emergency contraception as your only form of birth control. BARRIER METHODS   Female condom. This is a thin sheath (latex or rubber) that is worn over the penis during sexual intercourse. It can be used with spermicide to increase effectiveness.  Female condom. This is a soft, loose-fitting sheath that is put into the vagina before sexual intercourse.  Diaphragm. This is a soft, latex, dome-shaped barrier that must be fitted by a health care provider. It is inserted into the vagina,  along with a spermicidal jelly. It is inserted before intercourse. The diaphragm should be left in the vagina for 6 to 8 hours after intercourse.  Cervical cap. This is a round, soft, latex or plastic cup that fits over the cervix and must be fitted by a health care provider. The cap can be left in place for up to 48 hours after intercourse.  Sponge. This is a soft, circular piece of polyurethane foam. The sponge has spermicide in it. It is inserted into the vagina after wetting it and before sexual intercourse.  Spermicides. These are chemicals that kill or block sperm from entering the cervix and uterus. They come in the form of creams, jellies, suppositories, foam, or tablets. They do not require a prescription. They are inserted into the vagina with an applicator before having sexual intercourse. The process must be repeated every time you have sexual intercourse. INTRAUTERINE CONTRACEPTION  Intrauterine device (IUD). This is a T-shaped device that is put in a woman's uterus during a menstrual period to prevent pregnancy. There are 2 types:  Copper IUD. This type of IUD is wrapped in copper wire and is placed inside the uterus. Copper makes the uterus and fallopian tubes produce a fluid that kills sperm. It can stay in place for 10 years.  Hormone IUD. This type of IUD contains the hormone progestin (synthetic progesterone). The hormone  thickens the cervical mucus and prevents sperm from entering the uterus, and it also thins the uterine lining to prevent implantation of a fertilized egg. The hormone can weaken or kill the sperm that get into the uterus. It can stay in place for 3-5 years, depending on which type of IUD is used. PERMANENT METHODS OF CONTRACEPTION  Female tubal ligation. This is when the woman's fallopian tubes are surgically sealed, tied, or blocked to prevent the egg from traveling to the uterus.  Hysteroscopic sterilization. This involves placing a small coil or insert into  each fallopian tube. Your doctor uses a technique called hysteroscopy to do the procedure. The device causes scar tissue to form. This results in permanent blockage of the fallopian tubes, so the sperm cannot fertilize the egg. It takes about 3 months after the procedure for the tubes to become blocked. You must use another form of birth control for these 3 months.  Female sterilization. This is when the female has the tubes that carry sperm tied off (vasectomy).This blocks sperm from entering the vagina during sexual intercourse. After the procedure, the man can still ejaculate fluid (semen). NATURAL PLANNING METHODS  Natural family planning. This is not having sexual intercourse or using a barrier method (condom, diaphragm, cervical cap) on days the woman could become pregnant.  Calendar method. This is keeping track of the length of each menstrual cycle and identifying when you are fertile.  Ovulation method. This is avoiding sexual intercourse during ovulation.  Symptothermal method. This is avoiding sexual intercourse during ovulation, using a thermometer and ovulation symptoms.  Post-ovulation method. This is timing sexual intercourse after you have ovulated. Regardless of which type or method of contraception you choose, it is important that you use condoms to protect against the transmission of sexually transmitted infections (STIs). Talk with your health care provider about which form of contraception is most appropriate for you. Document Released: 05/07/2005 Document Revised: 05/12/2013 Document Reviewed: 10/30/2012 [facility] Patient Information 2015 [place]. This information is not intended to replace advice given to you by your health care provider. Make sure you discuss any questions you have with your health care provider. Postpartum Depression and Baby Blues The postpartum period begins right after the birth of a baby. During this time, there is often a great amount of joy and  excitement. It is also a time of many changes in the life of the parents. Regardless of how many times a mother gives birth, each child brings new challenges and dynamics to the family. It is not unusual to have feelings of excitement along with confusing shifts in moods, emotions, and thoughts. All mothers are at risk of developing postpartum depression or the "baby blues." These mood changes can occur right after giving birth, or they may occur many months after giving birth. The baby blues or postpartum depression can be mild or severe. Additionally, postpartum depression can go away rather quickly, or it can be a long-term condition.  CAUSES Raised hormone levels and the rapid drop in those levels are thought to be a main cause of postpartum depression and the baby blues. A number of hormones change during and after pregnancy. Estrogen and progesterone usually decrease right after the delivery of your baby. The levels of thyroid hormone and various cortisol steroids also rapidly drop. Other factors that play a role in these mood changes include major life events and genetics.  RISK FACTORS If you have any of the following risks for the baby blues or postpartum  depression, know what symptoms to watch out for during the postpartum period. Risk factors that may increase the likelihood of getting the baby blues or postpartum depression include:  Having a personal or family history of depression.   Having depression while being pregnant.   Having premenstrual mood issues or mood issues related to oral contraceptives.  Having a lot of life stress.   Having marital conflict.   Lacking a social support network.   Having a baby with special needs.   Having health problems, such as diabetes.  SIGNS AND SYMPTOMS Symptoms of baby blues include:  Brief changes in mood, such as going from extreme happiness to sadness.  Decreased concentration.   Difficulty sleeping.   Crying spells,  tearfulness.   Irritability.   Anxiety.  Symptoms of postpartum depression typically begin within the first month after giving birth. These symptoms include:  Difficulty sleeping or excessive sleepiness.   Marked weight loss.   Agitation.   Feelings of worthlessness.   Lack of interest in activity or food.  Postpartum psychosis is a very serious condition and can be dangerous. Fortunately, it is rare. Displaying any of the following symptoms is cause for immediate medical attention. Symptoms of postpartum psychosis include:   Hallucinations and delusions.   Bizarre or disorganized behavior.   Confusion or disorientation.  DIAGNOSIS  A diagnosis is made by an evaluation of your symptoms. There are no medical or lab tests that lead to a diagnosis, but there are various questionnaires that a health care provider may use to identify those with the baby blues, postpartum depression, or psychosis. Often, a screening tool called the [place] Postnatal Depression Scale is used to diagnose depression in the postpartum period.  TREATMENT The baby blues usually goes away on its own in 1-2 weeks. Social support is often all that is needed. You will be encouraged to get adequate sleep and rest. Occasionally, you may be given medicines to help you sleep.  Postpartum depression requires treatment because it can last several months or longer if it is not treated. Treatment may include individual or group therapy, medicine, or both to address any social, physiological, and psychological factors that may play a role in the depression. Regular exercise, a healthy diet, rest, and social support may also be strongly recommended.  Postpartum psychosis is more serious and needs treatment right away. Hospitalization is often needed. HOME CARE INSTRUCTIONS  Get as much rest as you can. Nap when the baby sleeps.   Exercise regularly. Some women find yoga and walking to be beneficial.   Eat a  balanced and nourishing diet.   Do little things that you enjoy. Have a cup of tea, take a bubble bath, read your favorite magazine, or listen to your favorite music.  Avoid alcohol.   Ask for help with household chores, cooking, grocery shopping, or running errands as needed. Do not try to do everything.   Talk to people close to you about how you are feeling. Get support from your partner, family members, friends, or other new moms.  Try to stay positive in how you think. Think about the things you are grateful for.   Do not spend a lot of time alone.   Only take over-the-counter or prescription medicine as directed by your health care provider.  Keep all your postpartum appointments.   Let your health care provider know if you have any concerns.  SEEK MEDICAL CARE IF: You are having a reaction to or problems with your  medicine. SEEK IMMEDIATE MEDICAL CARE IF:  You have suicidal feelings.   You think you may harm the baby or someone else. MAKE SURE YOU:  Understand these instructions.  Will watch your condition.  Will get help right away if you are not doing well or get worse. Document Released: 02/09/2004 Document Revised: 05/12/2013 Document Reviewed: 02/16/2013 [facility] Patient Information 2015 [place], [place]. This information is not intended to replace advice given to you by your health care provider. Make sure you discuss any questions you have with your health care provider. Postpartum Care After Vaginal Delivery After you deliver your newborn (postpartum period), the usual stay in the hospital is 24-72 hours. If there were problems with your labor or delivery, or if you have other medical problems, you might be in the hospital longer.  While you are in the hospital, you will receive help and instructions on how to care for yourself and your newborn during the postpartum period.  While you are in the hospital:  Be sure to tell your nurses if you have pain or  discomfort, as well as where you feel the pain and what makes the pain worse.  If you had an incision made near your vagina (episiotomy) or if you had some tearing during delivery, the nurses may put ice packs on your episiotomy or tear. The ice packs may help to reduce the pain and swelling.  If you are breastfeeding, you may feel uncomfortable contractions of your uterus for a couple of weeks. This is normal. The contractions help your uterus get back to normal size.  It is normal to have some bleeding after delivery.  For the first 1-3 days after delivery, the flow is red and the amount may be similar to a period.  It is common for the flow to start and stop.  In the first few days, you may pass some small clots. Let your nurses know if you begin to pass large clots or your flow increases.  Do not  flush blood clots down the toilet before having the nurse look at them.  During the next 3-10 days after delivery, your flow should become more watery and pink or brown-tinged in color.  Ten to fourteen days after delivery, your flow should be a small amount of yellowish-white discharge.  The amount of your flow will decrease over the first few weeks after delivery. Your flow may stop in 6-8 weeks. Most women have had their flow stop by 12 weeks after delivery.  You should change your sanitary pads frequently.  Wash your hands thoroughly with soap and water for at least 20 seconds after changing pads, using the toilet, or before holding or feeding your newborn.  You should feel like you need to empty your bladder within the first 6-8 hours after delivery.  In case you become weak, lightheaded, or faint, call your nurse before you get out of bed for the first time and before you take a shower for the first time.  Within the first few days after delivery, your breasts may begin to feel tender and full. This is called engorgement. Breast tenderness usually goes away within 48-72 hours after  engorgement occurs. You may also notice milk leaking from your breasts. If you are not breastfeeding, do not stimulate your breasts. Breast stimulation can make your breasts produce more milk.  Spending as much time as possible with your newborn is very important. During this time, you and your newborn can feel close and get to  know each other. Having your newborn stay in your room (rooming in) will help to strengthen the bond with your newborn. It will give you time to get to know your newborn and become comfortable caring for your newborn.  Your hormones change after delivery. Sometimes the hormone changes can temporarily cause you to feel sad or tearful. These feelings should not last more than a few days. If these feelings last longer than that, you should talk to your caregiver.  If desired, talk to your caregiver about methods of family planning or contraception.  Talk to your caregiver about immunizations. Your caregiver may want you to have the following immunizations before leaving the hospital:  Tetanus, diphtheria, and pertussis (Tdap) or tetanus and diphtheria (Td) immunization. It is very important that you and your family (including grandparents) or others caring for your newborn are up-to-date with the Tdap or Td immunizations. The Tdap or Td immunization can help protect your newborn from getting ill.  Rubella immunization.  Varicella (chickenpox) immunization.  Influenza immunization. You should receive this annual immunization if you did not receive the immunization during your pregnancy. Document Released: 03/04/2007 Document Revised: 01/30/2012 Document Reviewed: 01/02/2012 [facility] Patient Information 2015 [place], [place]. This information is not intended to replace advice given to you by your health care provider. Make sure you discuss any questions you have with your health care provider.

## 2013-11-30 NOTE — Discharge Summary (Signed)
Vaginal Delivery Discharge Summary  Julie Boyd  DOB:    04/12/1976 MRN:    [id number] CSN:    [id number]  Date of admission:                  11/29/13  Date of discharge:                   11/30/13  Procedures this admission:  SVD   Date of Delivery: 11/29/13  Newborn Data:  Live born female  Birth Weight: 7 lb 0.5 oz (3190 g) APGAR: 8, 9  Home with mother. Name: Julie Boyd Circumcision Plan: None  History of Present Illness:  Ms. Julie Boyd is a 38 y.o. female, [phone], who presents at 5846w0d weeks gestation. [facility] She was admitted rupture of membranes. Her pregnancy has been complicated by:  Patient Active Problem List   Diagnosis Date Noted  . First trimester bleeding 11/29/2013  . Elderly multigravida 11/29/2013  . Thrombocytopenia, unspecified 11/29/2013  . Vaginal delivery 11/29/2013     Hospital Course:  The patient was admitted on 11/29/13. Neg GBS. She proceeded to have a vaginal delivery of a healthy infant w/o medication for pain. Her delivery was not complicated, and was attended by Julie BridgemanVicki Boyd, CNM. Patient and baby tolerated the procedure without difficulty, with no laceration(s) noted. Infant status was stable and remained in room with mother.  Mother and infant then had an uncomplicated postpartum course, with breastfeeding going well. Mom's physical exam was WNL, and she was discharged home in stable condition. Contraception plan was condoms/coitus interruptus. She received adequate benefit from po pain medications.   Feeding:  breast  Contraception:  condoms, withdrawal method  Discharge hemoglobin:  Hemoglobin  Date Value Ref Range Status  11/30/2013 13.1  12.0 - 15.0 g/dL Final     HCT  Date Value Ref Range Status  11/30/2013 38.0  36.0 - 46.0 % Final    Discharge Physical Exam:   General:  alert Lochia: appropriate Uterine Fundus: firm Incision: Intact DVT Evaluation: No evidence of DVT seen on physical exam. Negative Homan's sign.  Intrapartum Procedures: spontaneous vaginal delivery Postpartum Procedures: Methergine series x 24 hrs due to slight softness of uterus that persisted in the immediate pp recovery period.    Complications-Operative and Postpartum: none  Discharge Diagnoses: Term Pregnancy-delivered, AMA, Gestational Thrombocytopenia  Discharge Information:  Activity:           pelvic rest Diet:                routine Medications: PNV and Percocet Condition:      stable     Discharge to: home  Follow-up Information   [facility] 6 weeks. (Call sooner with questions or concerns.)    Specialty:  Obstetrics and Gynecology   Contact information:   [address] [place] [id number] [phone]       Sherre ScarletWILLIAMS, Julie Shonk CNM 11/30/2013 9:24 AM

## 2014-03-05 ENCOUNTER — Other Ambulatory Visit: Payer: Self-pay

## 2014-03-22 ENCOUNTER — Encounter (HOSPITAL_COMMUNITY): Payer: Self-pay | Admitting: *Deleted

## 2015-05-05 ENCOUNTER — Ambulatory Visit (INDEPENDENT_AMBULATORY_CARE_PROVIDER_SITE_OTHER): Payer: BLUE CROSS/BLUE SHIELD | Admitting: Physician Assistant

## 2015-05-05 VITALS — BP 110/70 | HR 66 | Temp 98.5°F | Resp 18 | Ht 61.0 in | Wt 160.2 lb

## 2015-05-05 DIAGNOSIS — Z6830 Body mass index (BMI) 30.0-30.9, adult: Secondary | ICD-10-CM

## 2015-05-05 DIAGNOSIS — D696 Thrombocytopenia, unspecified: Secondary | ICD-10-CM

## 2015-05-05 DIAGNOSIS — Z13220 Encounter for screening for lipoid disorders: Secondary | ICD-10-CM | POA: Diagnosis not present

## 2015-05-05 DIAGNOSIS — Z0000 Encounter for general adult medical examination without abnormal findings: Secondary | ICD-10-CM | POA: Diagnosis not present

## 2015-05-05 DIAGNOSIS — Z13228 Encounter for screening for other metabolic disorders: Secondary | ICD-10-CM | POA: Diagnosis not present

## 2015-05-05 DIAGNOSIS — Z1329 Encounter for screening for other suspected endocrine disorder: Secondary | ICD-10-CM

## 2015-05-05 DIAGNOSIS — Z23 Encounter for immunization: Secondary | ICD-10-CM

## 2015-05-05 LAB — CBC WITH DIFFERENTIAL/PLATELET
BASOS ABS: 0.1 10*3/uL (ref 0.0–0.1)
BASOS PCT: 1 % (ref 0–1)
Eosinophils Absolute: 0.1 10*3/uL (ref 0.0–0.7)
Eosinophils Relative: 2 % (ref 0–5)
HCT: 41.7 % (ref 36.0–46.0)
HEMOGLOBIN: 14 g/dL (ref 12.0–15.0)
Lymphocytes Relative: 25 % (ref 12–46)
Lymphs Abs: 1.4 10*3/uL (ref 0.7–4.0)
MCH: 29 pg (ref 26.0–34.0)
MCHC: 33.6 g/dL (ref 30.0–36.0)
MCV: 86.3 fL (ref 78.0–100.0)
MPV: 10.1 fL (ref 8.6–12.4)
Monocytes Absolute: 0.3 10*3/uL (ref 0.1–1.0)
Monocytes Relative: 5 % (ref 3–12)
NEUTROS ABS: 3.8 10*3/uL (ref 1.7–7.7)
Neutrophils Relative %: 67 % (ref 43–77)
Platelets: 172 10*3/uL (ref 150–400)
RBC: 4.83 MIL/uL (ref 3.87–5.11)
RDW: 13.5 % (ref 11.5–15.5)
WBC: 5.7 10*3/uL (ref 4.0–10.5)

## 2015-05-05 LAB — TSH: TSH: 2.33 u[IU]/mL (ref 0.350–4.500)

## 2015-05-05 NOTE — Progress Notes (Signed)
Patient ID: Julie Boyd, female    DOB: 1975/06/22, 39 y.o.   MRN: [id number]  PCP: Esmeralda Arthur, MD  Chief Complaint  Patient presents with  . Annual Exam    W/O pelvic exam    Subjective:   HPI: Presents for annual wellness exam. Had requested Dr. Conley Rolls (her husband has seen her) at the front desk, but that message was not passed to the clinical staff. Breast and pelvic exams with Dr. Estanislado Pandy.  She has no specific problems or concerns today, just thinks that she should have a physical.  Has never had a seasonal flu vaccine, and doesn't want one, thinking that it might make her sick. After education about the seasonal flu vaccine, she decides she would like one.    Patient Active Problem List   Diagnosis Date Noted  . Thrombocytopenia, unspecified (HCC) 11/29/2013    Past Medical History  Diagnosis Date  . Medical history non-contributory   . Allergy   . Elderly multigravida 11/29/2013  . First trimester bleeding 11/29/2013  . Vaginal delivery 11/29/2013     Prior to Admission medications   Medication Sig Start Date End Date Taking? Authorizing Provider  Prenatal Vit-Fe Fumarate-FA (PRENATAL MULTIVITAMIN) TABS tablet Take 1 tablet by mouth daily at 12 noon.   Yes Historical Provider, MD  ibuprofen (ADVIL,MOTRIN) 200 MG tablet Take 200 mg by mouth every 6 (six) hours as needed.    Historical Provider, MD    Allergies  Allergen Reactions  . Aleve [Naproxen] Rash    Pt says she can take Ibuprofen without a problem    Past Surgical History  Procedure Laterality Date  . Polyp removal      uterine polyp    Family History  Problem Relation Age of Onset  . Diabetes Father   . Cancer Maternal Grandfather     Social History   Social History  . Marital Status: Significant Other    Spouse Name: Annita Brod  . Number of Children: 3  . Years of Education: 12+   Occupational History  . 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 Smoking status: Never Smoker   . Smokeless tobacco: Never Used  . Alcohol Use: No  . Drug Use: No  . Sexual Activity:    Partners: Male    Birth Control/ Protection: Coitus interruptus   Other Topics Concern  . None   Social History Narrative   Lives with her long-time boyfriend and their 3 sons.   Born in [place]. Came to the [place] in 1994, at age 59.       Review of Systems  Constitutional: Negative.   HENT: Negative.   Eyes: Negative.   Respiratory: Negative.   Cardiovascular: Negative.   Gastrointestinal: Negative.   Genitourinary: Negative.   Musculoskeletal: Negative.   Skin: Negative.   Neurological: Negative.   Psychiatric/Behavioral: Negative.         Objective:  Physical Exam  Constitutional: She is oriented to person, place, and time. Vital signs are normal. She appears well-developed and well-nourished. She is active and cooperative. No distress.  BP 110/70 mmHg  Pulse 66  Temp(Src) 98.5 F (36.9 C) (Oral)  Resp 18  Ht  (1.549 m)  Wt 160 lb 3.2 oz (72.666 kg)  BMI 30.29 kg/m2  SpO2 98%  LMP 04/15/2015   HENT:  Head: Normocephalic and atraumatic.  Right Ear: Hearing, tyMahala Rommelxternal ear and ear canal normal. No  foreign bodies.  Left Ear: Hearing, tympanic membrane, external ear and ear canal normal. No foreign bodies.  Nose: Nose normal.  Mouth/Throat: Uvula is midline, oropharynx is clear and moist and mucous membranes are normal. No oral lesions. Normal dentition. No dental abscesses or uvula swelling. No oropharyngeal exudate.  Eyes: Conjunctivae, EOM and lids are normal. Pupils are equal, round, and reactive to light. Right eye exhibits no discharge. Left eye exhibits no discharge. No scleral icterus.  Fundoscopic exam:      The right eye shows no arteriolar narrowing, no AV nicking, no exudate, no hemorrhage and no papilledema. The right eye shows red reflex.       The left eye shows no arteriolar narrowing, no AV  nicking, no exudate, no hemorrhage and no papilledema. The left eye shows red reflex.  Neck: Trachea normal, normal range of motion and full passive range of motion without pain. Neck supple. No spinous process tenderness and no muscular tenderness present. No thyroid mass and no thyromegaly present.  Cardiovascular: Normal rate, regular rhythm, normal heart sounds, intact distal pulses and normal pulses.   Pulmonary/Chest: Effort normal and breath sounds normal.  Musculoskeletal: She exhibits no edema or tenderness.       Cervical back: Normal.       Thoracic back: Normal.       Lumbar back: Normal.  Lymphadenopathy:       Head (right side): No tonsillar, no preauricular, no posterior auricular and no occipital adenopathy present.       Head (left side): No tonsillar, no preauricular, no posterior auricular and no occipital adenopathy present.    She has no cervical adenopathy.       Right: No supraclavicular adenopathy present.       Left: No supraclavicular adenopathy present.  Neurological: She is alert and oriented to person, place, and time. She has normal strength and normal reflexes. No cranial nerve deficit. She exhibits normal muscle tone. Coordination and gait normal.  Skin: Skin is warm, dry and intact. No rash noted. She is not diaphoretic. No cyanosis or erythema. Nails show no clubbing.  Psychiatric: She has a normal mood and affect. Her speech is normal and behavior is normal. Judgment and thought content normal.           Assessment & Plan:  1. Annual physical exam Age appropriate anticipatory guidance provided.  2. Need for influenza vaccination - Flu Vaccine QUAD 36+ mos IM  3. Screening for hyperlipidemia - Lipid panel  4. Screening for metabolic disorder - Comprehensive metabolic panel  5. Screening for thyroid disorder - TSH  6. Thrombocytopenia, unspecified (HCC) - CBC with Differential/Platelet   Fernande Brashelle S. Joeph Szatkowski, PA-C Physician  Assistant-Certified [facility]

## 2015-05-05 NOTE — Patient Instructions (Signed)
I will contact you with your lab results as soon as they are available.   If you have not heard from me in 2 weeks, please contact me.  The fastest way to get your results is to register for My Chart (see the instructions on the last page of this printout).  Keeping You Healthy  Get These Tests 1. Blood Pressure- Have your blood pressure checked once a year by your health care Perline Awe.  Normal blood pressure is 120/80. 2. Weight- Have your body mass index (BMI) calculated to screen for obesity.  BMI is measure of body fat based on height and weight.  You can also calculate your own BMI at www.nhlbisupport.com/bmi/. 3. Cholesterol- Have your cholesterol checked every 5 years starting at age 20 then yearly starting at age 45. 4. Chlamydia, HIV, and other sexually transmitted diseases- Get screened every year until age 25, then within three months of each new sexual Julie Boyd. 5. Pap Test - Every 1-5 years; discuss with your health care Julie Boyd. 6. Mammogram- Every 1-2 years starting at age 40--50  Take these medicines  Calcium with Vitamin D-Your body needs 1200 mg of Calcium each day and 800-1000 IU of Vitamin D daily.  Your body can only absorb 500 mg of Calcium at a time so Calcium must be taken in 2 or 3 divided doses throughout the day.  Multivitamin with folic acid- Once daily if it is possible for you to become pregnant.  Get these Immunizations  Gardasil-Series of three doses; prevents HPV related illness such as genital warts and cervical cancer.  Menactra-Single dose; prevents meningitis.  Tetanus shot- Every 10 years.  Flu shot-Every year.  Take these steps 1. Do not smoke-Your healthcare Laura Radilla can help you quit.  For tips on how to quit go to www.smokefree.gov or call 1-800 QUITNOW. 2. Be physically active- Exercise 5 days a week for at least 30 minutes.  If you are not already physically active, start slow and gradually work up to 30 minutes of moderate physical  activity.  Examples of moderate activity include walking briskly, dancing, swimming, bicycling, etc. 3. Breast Cancer- A self breast exam every month is important for early detection of breast cancer.  For more information and instruction on self breast exams, ask your healthcare Julie Boyd or www.womenshealth.gov/faq/breast-self-exam.cfm. 4. Eat a healthy diet- Eat a variety of healthy foods such as fruits, vegetables, whole grains, low fat milk, low fat cheeses, yogurt, lean meats, poultry and fish, beans, nuts, tofu, etc.  For more information go to www. Thenutritionsource.org 5. Drink alcohol in moderation- Limit alcohol intake to one drink or less per day. Never drink and drive. 6. Depression- Your emotional health is as important as your physical health.  If you're feeling down or losing interest in things you normally enjoy please talk to your healthcare Julie Boyd about being screened for depression. 7. Dental visit- Brush and floss your teeth twice daily; visit your dentist twice a year. 8. Eye doctor- Get an eye exam at least every 2 years. 9. Helmet use- Always wear a helmet when riding a bicycle, motorcycle, rollerblading or skateboarding. 10. Safe sex- If you may be exposed to sexually transmitted infections, use a condom. 11. Seat belts- Seat belts can save your live; always wear one. 12. Smoke/Carbon Monoxide detectors- These detectors need to be installed on the appropriate level of your home. Replace batteries at least once a year. 13. Skin cancer- When out in the sun please cover up and use sunscreen 15 SPF   or higher. 14. Violence- If anyone is threatening or hurting you, please tell your healthcare Julie Boyd.        

## 2015-05-06 DIAGNOSIS — Z6830 Body mass index (BMI) 30.0-30.9, adult: Secondary | ICD-10-CM | POA: Insufficient documentation

## 2015-05-06 LAB — COMPREHENSIVE METABOLIC PANEL
ALBUMIN: 4.3 g/dL (ref 3.6–5.1)
ALT: 10 U/L (ref 6–29)
AST: 11 U/L (ref 10–30)
Alkaline Phosphatase: 45 U/L (ref 33–115)
BILIRUBIN TOTAL: 0.6 mg/dL (ref 0.2–1.2)
BUN: 9 mg/dL (ref 7–25)
CO2: 27 mmol/L (ref 20–31)
CREATININE: 0.51 mg/dL (ref 0.50–1.10)
Calcium: 8.9 mg/dL (ref 8.6–10.2)
Chloride: 104 mmol/L (ref 98–110)
GLUCOSE: 75 mg/dL (ref 65–99)
Potassium: 4.2 mmol/L (ref 3.5–5.3)
SODIUM: 137 mmol/L (ref 135–146)
Total Protein: 6.9 g/dL (ref 6.1–8.1)

## 2015-05-06 LAB — LIPID PANEL
Cholesterol: 176 mg/dL (ref 125–200)
HDL: 77 mg/dL (ref >=46)
LDL Cholesterol: 86 mg/dL (ref <130)
Total CHOL/HDL Ratio: 2.3 Ratio (ref <=5.0)
Triglycerides: 66 mg/dL (ref <150)
VLDL: 13 mg/dL (ref <30)

## 2015-07-06 ENCOUNTER — Telehealth: Payer: Self-pay | Admitting: Psychology

## 2015-07-06 NOTE — Telephone Encounter (Signed)
Patient left a VM inquiring about an appointment.  I am not taking patients outside the [facility].  Called her back and left a VM.  Would be happy to help her find someone.

## 2015-07-14 ENCOUNTER — Encounter: Payer: Self-pay | Admitting: Physician Assistant

## 2015-07-14 DIAGNOSIS — M4317 Spondylolisthesis, lumbosacral region: Secondary | ICD-10-CM

## 2015-10-18 DIAGNOSIS — Z01419 Encounter for gynecological examination (general) (routine) without abnormal findings: Secondary | ICD-10-CM | POA: Diagnosis not present

## 2015-10-18 DIAGNOSIS — Z124 Encounter for screening for malignant neoplasm of cervix: Secondary | ICD-10-CM | POA: Diagnosis not present

## 2015-10-18 DIAGNOSIS — Z3040 Encounter for surveillance of contraceptives, unspecified: Secondary | ICD-10-CM | POA: Diagnosis not present

## 2015-10-18 DIAGNOSIS — Z6831 Body mass index (BMI) 31.0-31.9, adult: Secondary | ICD-10-CM | POA: Diagnosis not present

## 2015-10-18 LAB — HM PAP SMEAR: HM Pap smear: NORMAL

## 2015-11-01 ENCOUNTER — Encounter: Payer: Self-pay | Admitting: Physician Assistant

## 2015-11-01 DIAGNOSIS — B009 Herpesviral infection, unspecified: Secondary | ICD-10-CM | POA: Insufficient documentation

## 2015-11-01 DIAGNOSIS — Z9289 Personal history of other medical treatment: Secondary | ICD-10-CM | POA: Insufficient documentation

## 2016-01-12 DIAGNOSIS — L731 Pseudofolliculitis barbae: Secondary | ICD-10-CM | POA: Diagnosis not present

## 2016-02-03 DIAGNOSIS — R351 Nocturia: Secondary | ICD-10-CM | POA: Diagnosis not present

## 2016-02-03 DIAGNOSIS — N8110 Cystocele, unspecified: Secondary | ICD-10-CM | POA: Diagnosis not present

## 2016-02-03 DIAGNOSIS — K121 Other forms of stomatitis: Secondary | ICD-10-CM | POA: Diagnosis not present

## 2016-10-25 DIAGNOSIS — Z01419 Encounter for gynecological examination (general) (routine) without abnormal findings: Secondary | ICD-10-CM | POA: Diagnosis not present

## 2016-10-25 DIAGNOSIS — Z3040 Encounter for surveillance of contraceptives, unspecified: Secondary | ICD-10-CM | POA: Diagnosis not present

## 2016-10-25 DIAGNOSIS — Z6830 Body mass index (BMI) 30.0-30.9, adult: Secondary | ICD-10-CM | POA: Diagnosis not present

## 2016-10-25 DIAGNOSIS — R5383 Other fatigue: Secondary | ICD-10-CM | POA: Diagnosis not present

## 2016-10-25 DIAGNOSIS — R32 Unspecified urinary incontinence: Secondary | ICD-10-CM | POA: Diagnosis not present

## 2016-12-12 ENCOUNTER — Ambulatory Visit (INDEPENDENT_AMBULATORY_CARE_PROVIDER_SITE_OTHER): Payer: BLUE CROSS/BLUE SHIELD | Admitting: Allergy & Immunology

## 2016-12-12 ENCOUNTER — Encounter: Payer: Self-pay | Admitting: Allergy & Immunology

## 2016-12-12 VITALS — BP 92/66 | HR 68 | Temp 98.3°F | Resp 14 | Ht 60.5 in | Wt 160.0 lb

## 2016-12-12 DIAGNOSIS — J3089 Other allergic rhinitis: Secondary | ICD-10-CM

## 2016-12-12 DIAGNOSIS — J302 Other seasonal allergic rhinitis: Secondary | ICD-10-CM | POA: Diagnosis not present

## 2016-12-12 DIAGNOSIS — T781XXD Other adverse food reactions, not elsewhere classified, subsequent encounter: Secondary | ICD-10-CM | POA: Diagnosis not present

## 2016-12-12 MED ORDER — AZELASTINE HCL 0.1 % NA SOLN: 2.0000 | Freq: Two times a day (BID) | NASAL | 5 refills | Status: DC

## 2016-12-12 MED ORDER — LEVOCETIRIZINE DIHYDROCHLORIDE 5 MG PO TABS: 5.0000 mg | ORAL_TABLET | Freq: Every evening | ORAL | 5 refills | Status: DC

## 2016-12-12 MED ORDER — FLUTICASONE PROPIONATE 50 MCG/ACT NA SUSP: 2.0000 | Freq: Every day | NASAL | 5 refills | Status: DC

## 2016-12-12 NOTE — Progress Notes (Signed)
NEW PATIENT  Date of Service/Encounter:  12/12/16  Referring provider: Delsa Bern, MD   Assessment:   Seasonal and perennial allergic rhinitis (trees, weeds, grasses, molds, dust mites and cockroach)  Pollen-food allergy syndrome  Plan/Recommendations:   1. Seasonal and perennial allergic rhinitis - Testing today showed: trees, weeds, grasses, molds, dust mites and cockroach - Avoidance measures provided. - Stop the Allegra and Zyrtec since these are not working.  - Start Dymista two sprays per nostril 1-2 times daily, Xyzal (levocetirizine) '5mg'$  tablet once daily and Singulair (montelukast) '10mg'$  daily  - You can use the Xyzal as needed. - You can use an extra dose of the antihistamine, if needed, for breakthrough symptoms.  - Consider nasal saline rinses 1-2 times daily to remove allergens from the nasal cavities as well as help with mucous clearance (this is especially helpful to do before the nasal sprays are given) - Consider allergy shots as a means of long-term control. - Allergy shots "re-train" the immune system to ignore environmental allergens and decrease the resulting immune response to those allergens (sneezing, itchy watery eyes, runny nose, nasal congestion, etc).   - We can discuss more at the next appointment if the medications are not working for you.  2. Oral allergy syndrome - This is caused by a cross reactivity between the proteins on fruits/vegetables and the pollens.  - The incidence of anaphylaxis is <1% in these patients. - There are case reports that immunotherapy can decrease symptoms of OAS, but this is not an indication for allergen immunotherapy by itself.   3. Return in about 3 months (around 03/14/2017).   Subjective:   Chabely Norby is a 41 y.o. female presenting today for evaluation of  Chief Complaint  Patient presents with  . Allergies  . Nasal Congestion  . Headache    Coralee Rud has a history of the  following: Patient Active Problem List   Diagnosis Date Noted  . Seasonal and perennial allergic rhinitis 12/12/2016  . Pollen-food allergy syndrome, subsequent encounter 12/12/2016  . History of positive PPD 11/01/2015  . HSV infection 11/01/2015  . Spondylolisthesis at L5-S1 level 07/14/2015  . BMI 30.0-30.9,adult 05/06/2015  . Thrombocytopenia, unspecified ([place]) 11/29/2013    History obtained from: chart review and patient.  Coralee Rud was referred by Delsa Bern, MD.     Carmaleta is a 41 y.o. female presenting for an allergy evaluation. Symptoms started around ten years ago. Her husband runs a lawn care business. But she developed sneezing and nasal rhinorrhea. When she is around leaves or dust, which causes her to develop nasal congestion and drainage. Lately this has become a problem indoors as well. She does endorse sinus pressure in the frontal areas of her sinuses as well as pressure.   She has tried cetirizine as well as fexofenadine. The [place] worked for a couple of weeks, but then stopped. She has tried nasal sprays (fluticasone). She has tried some OTC eye drops with minimal relief.   She has never had asthma, although her son does have asthma. She may have had some coughing episodes when she was in her teens or so. She was given something that was orange flavored as a a child in [place]. She denies nighttime coughing as well as daytime symptoms. She does feel wheezy on a couple of occasions. She has never needed steroids or been to the ED or Urgent Care for her symptoms. She eats all foods without a problem.    Otherwise, there is  no history of other atopic diseases, including asthma, drug allergies, food allergies, stinging insect allergies, or urticaria. There is no significant infectious history. Vaccinations are up to date.    Past Medical History: Patient Active Problem List   Diagnosis Date Noted  . Seasonal and perennial allergic rhinitis 12/12/2016  .  Pollen-food allergy syndrome, subsequent encounter 12/12/2016  . History of positive PPD 11/01/2015  . HSV infection 11/01/2015  . Spondylolisthesis at L5-S1 level 07/14/2015  . BMI 30.0-30.9,adult 05/06/2015  . Thrombocytopenia, unspecified ([place]) 11/29/2013    Medication List:   Allergies as of 12/12/2016      Reactions   Sulfa Antibiotics Rash   Aleve [naproxen] Rash   Pt says she can take Ibuprofen without a problem      Medication List       Accurate as of 12/12/16  4:40 PM. Always use your most recent med list.          azelastine 0.1 % nasal spray Commonly known as:  ASTELIN Place 2 sprays into both nostrils 2 (two) times daily. Use in each nostril as directed   chlorhexidine 0.12 % solution Commonly known as:  PERIDEX chlorhexidine gluconate 0.12 % mouthwash   fluticasone 50 MCG/ACT nasal spray Commonly known as:  FLONASE Place 2 sprays into both nostrils daily.   ibuprofen 200 MG tablet Commonly known as:  ADVIL,MOTRIN Take 200 mg by mouth every 6 (six) hours as needed.   levocetirizine 5 MG tablet Commonly known as:  XYZAL Take 1 tablet (5 mg total) by mouth every evening.   MULTIVITAMIN ADULT EXTRA C PO multivitamin   valACYclovir 1000 MG tablet Commonly known as:  VALTREX valacyclovir 1 gram tablet  TAKE 1 TABLET BY MOUTH EVERY 12 HOURS FOR 5 DAYS   Vitamin C 100 MG Chew Vitamin C       Birth History: non-contributory. She has been in the [place] for 25 years.    Developmental History: non-contributory.   Past Surgical History: Past Surgical History:  Procedure Laterality Date  . Polyp removal     uterine polyp     Family History: Family History  Problem Relation Age of Onset  . Diabetes Father   . Cancer Maternal Grandfather   . Asthma Son   . Allergic rhinitis Neg Hx   . Angioedema Neg Hx   . Eczema Neg Hx   . Immunodeficiency Neg Hx   . Urticaria Neg Hx      Social History: Shonica lives at home with her husband as well as three  children (one high school, one middle school, and one 39yo). They live in a 39yo home. There is carpeting throughout the home. They have electric heating and central cooling. There are 16 chickens outdoors. There are no dust mite coverings on the bedding. 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 She has lived in the [place] for 25 years and met her husband here in [place]. Her husband is also from [place].     Review of Systems: a 14-point review of systems is pertinent for what is mentioned in HPI.  Otherwise, all other systems were negative. Constitutional: negative other than that listed in the HPI Eyes: negative other than that listed in the HPI Ears, nose, mouth, throat, and face: negative other than that listed in the HPI Respiratory: negative other than that listed in the HPI Cardiovascular: negative other than that listed in the HPI Gastrointestinal: negative other than  that listed in the HPI Genitourinary: negative other than that listed in the HPI Integument: negative other than that listed in the HPI Hematologic: negative other than that listed in the HPI Musculoskeletal: negative other than that listed in the HPI Neurological: negative other than that listed in the HPI Allergy/Immunologic: negative other than that listed in the HPI    Objective:   Blood pressure 92/66, pulse 68, temperature 98.3 F (36.8 C), temperature source Oral, resp. rate 14, height 5' 0.5" (1.537 m), weight 160 lb (72.6 kg), SpO2 95 %, unknown if currently breastfeeding. Body mass index is 30.73 kg/m.   Physical Exam:  General: Alert, interactive, in no acute distress. Very interactive and pleasant female.  Eyes: No conjunctival injection present on the right, No conjunctival injection present on the left, PERRL bilaterally, No discharge on the right, No discharge on the left and No Horner-Trantas dots present Ears: Right TM pearly gray with  normal light reflex, Left TM pearly gray with normal light reflex, Right TM intact without perforation and Left TM intact without perforation.  Nose/Throat: External nose within normal limits, nasal crease present and septum midline, turbinates markedly edematous with clear discharge, post-pharynx erythematous with cobblestoning in the posterior oropharynx. Tonsils 2+ without exudates Neck: Supple without thyromegaly.  Adenopathy: [place] bilateral anterior cervical lymphadenopathy. and No enlarged lymph nodes appreciated in the occipital, axillary, epitrochlear, inguinal, or popliteal regions. Lungs: Clear to auscultation without wheezing, rhonchi or rales. No increased work of breathing. CV: Normal S1/S2, no murmurs. Capillary refill <2 seconds.  Abdomen: Nondistended, nontender. No guarding or rebound tenderness. Bowel sounds present in all fields and hyperactive  Skin: Warm and dry, without lesions or rashes. Extremities:  No clubbing, cyanosis or edema. Neuro:   Grossly intact. No focal deficits appreciated. Responsive to questions.  Diagnostic studies:     Allergy Studies:   Indoor/Outdoor Percutaneous Adult Environmental Panel: positive to cocklebur, sheep sorrel, birch, American beech, Box elder, [place], [place], [place], pecan pollen, Fusarium, Df mite and Dp mites. Otherwise negative with adequate controls.  Indoor/Outdoor Selected Intradermal Environmental Panel: positive to Grass mix, mold mix #2, mold mix #3 and mite mix. Otherwise negative with adequate controls.     Salvatore Marvel, MD [facility]

## 2016-12-12 NOTE — Patient Instructions (Addendum)
1. Seasonal and perennial allergic rhinitis - Testing today showed: trees, weeds, grasses, molds, dust mites and cockroach - Avoidance measures provided. - Stop the Allegra and Zyrtec since these are not working.  - Start Dymista two sprays per nostril 1-2 times daily, Xyzal (levocetirizine) 5mg  tablet once daily and Singulair (montelukast) 10mg  daily  - You can use the Xyzal as needed. - You can use an extra dose of the antihistamine, if needed, for breakthrough symptoms.  - Consider nasal saline rinses 1-2 times daily to remove allergens from the nasal cavities as well as help with mucous clearance (this is especially helpful to do before the nasal sprays are given) - Consider allergy shots as a means of long-term control. - Allergy shots "re-train" the immune system to ignore environmental allergens and decrease the resulting immune response to those allergens (sneezing, itchy watery eyes, runny nose, nasal congestion, etc).   - We can discuss more at the next appointment if the medications are not working for you.  2. Oral allergy syndrome - This is caused by a cross reactivity between the proteins on fruits/vegetables and the pollens.  - The incidence of anaphylaxis is <1% in these patients.  3. Return in about 3 months (around 03/14/2017).   Please inform us of any Emergency Department visits, hospitalizations, or changes in symptoms. Call us before going to the ED for breathing or allergy symptoms since we might be able to fit you in for a sick visit. Feel free to contact us anytime with any questions, problems, or concerns.  It was a pleasure to meet you today! Happy summer! Good luck with the flock of chickens!   Websites that have reliable patient information: 1. American Academy of Asthma, Allergy, and Immunology: www.aaaai.org 2. Food Allergy Research and Education (FARE): foodallergy.org 3. Mothers of Asthmatics: http://www.asthmacommunitynetwork.org 4. American College of  Allergy, Asthma, and Immunology: www.acaai.org  Reducing Pollen Exposure  The American Academy of Allergy, Asthma and Immunology suggests the following steps to reduce your exposure to pollen during allergy seasons.    1. Do not hang sheets or clothing out to dry; pollen may collect on these items. 2. Do not mow lawns or spend time around freshly cut grass; mowing stirs up pollen. 3. Keep windows closed at night.  Keep car windows closed while driving. 4. Minimize morning activities outdoors, a time when pollen counts are usually at their highest. 5. Stay indoors as much as possible when pollen counts or humidity is high and on windy days when pollen tends to remain in the air longer. 6. Use air conditioning when possible.  Many air conditioners have filters that trap the pollen spores. 7. Use a HEPA room air filter to remove pollen form the indoor air you breathe.  Control of House Dust Mite Allergen    House dust mites play a major role in allergic asthma and rhinitis.  They occur in environments with high humidity wherever human skin, the food for dust mites is found. High levels have been detected in dust obtained from mattresses, pillows, carpets, upholstered furniture, bed covers, clothes and soft toys.  The principal allergen of the house dust mite is found in its feces.  A gram of dust may contain 1,000 mites and 250,000 fecal particles.  Mite antigen is easily measured in the air during house cleaning activities.    1. Encase mattresses, including the box spring, and pillow, in an air tight cover.  Seal the zipper end of the encased mattresses with wide  adhesive tape. 2. Wash the bedding in water of 130 degrees Farenheit weekly.  Avoid cotton comforters/quilts and flannel bedding: the most ideal bed covering is the dacron comforter. 3. Remove all upholstered furniture from the bedroom. 4. Remove carpets, carpet padding, rugs, and non-washable window drapes from the bedroom.  Wash  drapes weekly or use plastic window coverings. 5. Remove all non-washable stuffed toys from the bedroom.  Wash stuffed toys weekly. 6. Have the room cleaned frequently with a vacuum cleaner and a damp dust-mop.  The patient should not be in a room which is being cleaned and should wait 1 hour after cleaning before going into the room. 7. Close and seal all heating outlets in the bedroom.  Otherwise, the room will become filled with dust-laden air.  An electric heater can be used to heat the room. 8. Reduce indoor humidity to less than 50%.  Do not use a humidifier.  Control of Mold Allergen  Mold and fungi can grow on a variety of surfaces provided certain temperature and moisture conditions exist.  Outdoor molds grow on plants, decaying vegetation and soil.  The major outdoor mold, Alternaria and Cladosporium, are found in very high numbers during hot and dry conditions.  Generally, a late Summer - Fall peak is seen for common outdoor fungal spores.  Rain will temporarily lower outdoor mold spore count, but counts rise rapidly when the rainy period ends.  The most important indoor molds are Aspergillus and Penicillium.  Dark, humid and poorly ventilated basements are ideal sites for mold growth.  The next most common sites of mold growth are the bathroom and the kitchen.  Outdoor MicrosoftMold Control 1. Use air conditioning and keep windows closed 2. Avoid exposure to decaying vegetation. 3. Avoid leaf raking. 4. Avoid grain handling. 5. Consider wearing a face mask if working in moldy areas.  Indoor Mold Control 1. Maintain humidity below 50%. 2. Clean washable surfaces with 5% bleach solution. 3. Remove sources e.g. contaminated carpets.  Control of Cockroach Allergen  Cockroach allergen has been identified as an important cause of acute attacks of asthma, especially in urban settings.  There are fifty-five species of cockroach that exist in the [place], however only three, the [place],  [place] and Oriental species produce allergen that can affect patients with Asthma.  Allergens can be obtained from fecal particles, egg casings and secretions from cockroaches.    1. Remove food sources. 2. Reduce access to water. 3. Seal access and entry points. 4. Spray runways with 0.5-1% Diazinon or Chlorpyrifos 5. Blow boric acid power under stoves and refrigerator. 6. Place bait stations (hydramethylnon) at feeding sites.   The oral allergy syndrome (OAS) or pollen-food allergy syndrome (PFAS) is a relatively common form of food allergy, particularly in adults. It typically occurs in people who have pollen allergies when the immune system "sees" proteins on the food that look like proteins on the pollen. This results in the allergy antibody (IgE) binding to the food instead of the pollen. Patients typically report itching and/or mild swelling of the mouth and throat immediately following ingestion of certain uncooked fruits (including nuts) or raw vegetables. Only a very small number of affected individuals experience systemic allergic reactions, such as anaphylaxis which occurs with true food allergies.

## 2017-03-06 ENCOUNTER — Ambulatory Visit: Payer: BLUE CROSS/BLUE SHIELD | Admitting: Allergy & Immunology

## 2017-03-15 ENCOUNTER — Ambulatory Visit (INDEPENDENT_AMBULATORY_CARE_PROVIDER_SITE_OTHER): Payer: BLUE CROSS/BLUE SHIELD | Admitting: Urgent Care

## 2017-03-15 DIAGNOSIS — Z23 Encounter for immunization: Secondary | ICD-10-CM | POA: Diagnosis not present

## 2017-03-19 NOTE — Progress Notes (Signed)
Subjective:    Patient ID: Julie Boyd, female    DOB: [birth year], 41 y.o.   MRN: [id number] Chief Complaint  Patient presents with  . Breast Pain    pain in upper left breast for a week     HPI  Julie Boyd is a 41 yo woman who is here with concerns about a left breast mass. This is my first time meeting this patient. She was last seen at our office 2 yrs ago for a CPE by a colleague.  Prior, she has been receiving her breast and pelvic exams with Dr. Silverio Lay, gynecology at [facility]. No prior mammograms.  No fibrocystic breast prior.   Had pain in left upper breast in March when she was weight-lifting so though she had pulled a muscle. Eventually went away.   1 weeks ago when she was eating, she felt something sharp poke her and after that they pain has been back for the past week.  Since then her chest and left arm has felt heavy and then she noticed pain behind her left scapula as well.  It was constant other than went away when she layed down to sleep at night.  When she is doing the dishes she will have to put her arm down on the sink to support herself.  It does not worsen with activity - she went running she did notice that she did get a little stitch in her side.  She thinks it might be getting better.  Is right-hand dominant.  Initially when sxs started, her left upper arm did feel weak/tired.   No GERD sxs. Normal GI/GU.  She has an appt next week for her annual check-up.  Also seen by Dr. Dellis Anes for seasonal allergies several months ago. No recent sxs, no cough.  She might have had asthma as a kid as one of her child does.  She remembered having a constant cough as a child and she eventually grew out of it.  He had her sotp the allergra and zyrtec and start dymista, xyzal, and singulair.  Past Medical History:  Diagnosis Date  . Allergy   . Elderly multigravida 11/29/2013  . First trimester bleeding 11/29/2013  . Medical history  non-contributory   . Vaginal delivery 11/29/2013   Past Surgical History:  Procedure Laterality Date  . Polyp removal     uterine polyp   Current Outpatient Prescriptions on File Prior to Visit  Medication Sig Dispense Refill  . Multiple Vitamins-Minerals (MULTIVITAMIN ADULT EXTRA C PO) multivitamin    . valACYclovir (VALTREX) 1000 MG tablet valacyclovir 1 gram tablet  TAKE 1 TABLET BY MOUTH EVERY 12 HOURS FOR 5 DAYS     No current facility-administered medications on file prior to visit.    Allergies  Allergen Reactions  . Sulfa Antibiotics Rash  . Aleve [Naproxen] Rash    Pt says she can take Ibuprofen without a problem   Family History  Problem Relation Age of Onset  . Diabetes Father   . Cancer Maternal Grandfather   . Asthma Son   . Allergic rhinitis Neg Hx   . Angioedema Neg Hx   . Eczema Neg Hx   . Immunodeficiency Neg Hx   . Urticaria Neg Hx    Social History   Social History  . Marital status: Significant Other    Spouse name: Annita Brod  . Number of children: 3  . Years of education: 12+   Occupational History  . 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 Smoking status: Never Smoker  . Smokeless tobacco: Never Used  . Alcohol use No  . Drug use: No  . Sexual activity: Yes    Partners: Male    Birth control/ protection: Coitus interruptus   Other Topics Concern  . None   Social History Narrative   Lives with her long-time boyfriend and their 3 sons.   Born in [place]. Came to the [place] in 1994, at age 82.   Depression screen [facility] 2/9 03/21/2017 05/05/2015  Decreased Interest 0 0  Down, Depressed, Hopeless 0 0  PHQ - 2 Score 0 0    Review of Systems See hpi    Objective:   Physical Exam  Constitutional: She is oriented to person, place, and time. She appears well-developed and well-nourished. No distress.  HENT:  Head: Normocephalic and atraumatic.  Right Ear: External ear normal.  Left Ear: External  ear normal.  Eyes: Conjunctivae are normal. No scleral icterus.  Neck: Normal range of motion. Neck supple. No thyromegaly present.  Cardiovascular: Normal rate, regular rhythm, normal heart sounds and intact distal pulses.   Pulmonary/Chest: Effort normal and breath sounds normal. No respiratory distress.  Musculoskeletal: She exhibits no edema.  Lymphadenopathy:    She has no cervical adenopathy.  Neurological: She is alert and oriented to person, place, and time.  Skin: Skin is warm and dry. She is not diaphoretic. No erythema.  Psychiatric: She has a normal mood and affect. Her behavior is normal.         BP 112/64   Pulse 72   Temp 98.2 F (36.8 C)   Resp 16   Ht 5' 0.5" (1.537 m)   Wt 154 lb 3.2 oz (69.9 kg)   LMP 03/14/2017   SpO2 99%   BMI 29.62 kg/m  Predicted peak flow 470; pre-peak flow 400; post-peak flow 490  Rt arm 100/60, Lt arm 94/66  Results for orders placed or performed in visit on 03/21/17  POCT CBC  Result Value Ref Range   WBC 5.2 4.6 - 10.2 K/uL   Lymph, poc 1.5 0.6 - 3.4   POC LYMPH PERCENT 28.9 10 - 50 %L   MID (cbc) 0.3 0 - 0.9   POC MID % 5.7 0 - 12 %M   POC Granulocyte 3.4 2 - 6.9   Granulocyte percent 65.4 37 - 80 %G   RBC 4.69 4.04 - 5.48 M/uL   Hemoglobin 13.7 12.2 - 16.2 g/dL   HCT, POC 47.8 29.5 - 47.9 %   MCV 85.6 80 - 97 fL   MCH, POC 29.3 27 - 31.2 pg   MCHC 34.2 31.8 - 35.4 g/dL   RDW, POC 62.1 %   Platelet Count, POC 192 142 - 424 K/uL   MPV 7.4 0 - 99.8 fL   Dg Chest 2 View  Result Date: 03/21/2017 CLINICAL DATA:  Left upper chest pain, no known injury, initial encounter EXAM: CHEST  2 VIEW COMPARISON:  None. FINDINGS: The heart size and mediastinal contours are within normal limits. Both lungs are clear. The visualized skeletal structures are unremarkable. IMPRESSION: No active cardiopulmonary disease. Electronically Signed   By: Alcide Clever M.D.   On: 03/21/2017 09:28   EKG: Sinus bradycardia, no acute ischemic changes  noted but flipped T waves are present in the medial chest leads. No prior EKG available for comparison.   I have personally reviewed the EKG tracing and agree with  the computer interpretation with the exception of possible anterior ischemia   Assessment & Plan:  Consider h. Pylori 1. Atypical chest pain     Orders Placed This Encounter  Procedures  . DG Chest 2 View    Standing Status:   Future    Number of Occurrences:   1    Standing Expiration Date:   03/21/2018    Order Specific Question:   Reason for Exam (SYMPTOM  OR DIAGNOSIS REQUIRED)    Answer:   left upper chest pain, kni, radiating to upper arm and back some    Order Specific Question:   Is the patient pregnant?    Answer:   No    Order Specific Question:   Preferred imaging location?    Answer:   External  . Care order/instruction:    Scheduling Instructions:     Peak Flow (IF NEB IS ORDERED PLEASE DO BEFORE AND AFTER NEB)  . Care order/instruction:    Scheduling Instructions:     Recheck BP in both arms  . POCT CBC  . EKG 12-Lead     Norberto SorensonEva Shaw, M.D.  Primary Care at [facility]  [facility] [address] [place], [place] [address] [phone] phone [phone] fax  03/23/17 5:33 PM

## 2017-03-21 ENCOUNTER — Ambulatory Visit (INDEPENDENT_AMBULATORY_CARE_PROVIDER_SITE_OTHER): Payer: BLUE CROSS/BLUE SHIELD

## 2017-03-21 ENCOUNTER — Encounter: Payer: Self-pay | Admitting: Family Medicine

## 2017-03-21 ENCOUNTER — Ambulatory Visit (INDEPENDENT_AMBULATORY_CARE_PROVIDER_SITE_OTHER): Payer: BLUE CROSS/BLUE SHIELD | Admitting: Family Medicine

## 2017-03-21 VITALS — BP 94/66 | HR 72 | Temp 98.2°F | Resp 16 | Ht 60.5 in | Wt 154.2 lb

## 2017-03-21 DIAGNOSIS — R0789 Other chest pain: Secondary | ICD-10-CM | POA: Diagnosis not present

## 2017-03-21 DIAGNOSIS — R079 Chest pain, unspecified: Secondary | ICD-10-CM | POA: Diagnosis not present

## 2017-03-21 DIAGNOSIS — R9431 Abnormal electrocardiogram [ECG] [EKG]: Secondary | ICD-10-CM

## 2017-03-21 LAB — POCT CBC
Granulocyte percent: 65.4 %G (ref 37–80)
HCT, POC: 40.2 % (ref 37.7–47.9)
Hemoglobin: 13.7 g/dL (ref 12.2–16.2)
Lymph, poc: 1.5 (ref 0.6–3.4)
MCH: 29.3 pg (ref 27–31.2)
MCHC: 34.2 g/dL (ref 31.8–35.4)
MCV: 85.6 fL (ref 80–97)
MID (CBC): 0.3 (ref 0–0.9)
MPV: 7.4 fL (ref 0–99.8)
POC Granulocyte: 3.4 (ref 2–6.9)
POC LYMPH PERCENT: 28.9 %L (ref 10–50)
POC MID %: 5.7 % (ref 0–12)
Platelet Count, POC: 192 10*3/uL (ref 142–424)
RBC: 4.69 M/uL (ref 4.04–5.48)
RDW, POC: 12.9 %
WBC: 5.2 10*3/uL (ref 4.6–10.2)

## 2017-03-21 NOTE — Patient Instructions (Addendum)
   IF you received an x-ray today, you will receive an invoice from [facility]. Please contact [facility] at [phone] with questions or concerns regarding your invoice.   IF you received labwork today, you will receive an invoice from [facility]. Please contact [facility] at [phone] with questions or concerns regarding your invoice.   Our billing staff will not be able to assist you with questions regarding bills from these companies.  You will be contacted with the lab results as soon as they are available. The fastest way to get your results is to activate your My Chart account. Instructions are located on the last page of this paperwork. If you have not heard from us regarding the results in 2 weeks, please contact this office.     Nonspecific Chest Pain Chest pain can be caused by many different conditions. There is always a chance that your pain could be related to something serious, such as a heart attack or a blood clot in your lungs. Chest pain can also be caused by conditions that are not life-threatening. If you have chest pain, it is very important to follow up with your health care provider. What are the causes? Causes of this condition include:  Heartburn.  Pneumonia or bronchitis.  Anxiety or stress.  Inflammation around your heart (pericarditis) or lung (pleuritis or pleurisy).  A blood clot in your lung.  A collapsed lung (pneumothorax). This can develop suddenly on its own (spontaneous pneumothorax) or from trauma to the chest.  Shingles infection (varicella-zoster virus).  Heart attack.  Damage to the bones, muscles, and cartilage that make up your chest wall. This can include: ? Bruised bones due to injury. ? Strained muscles or cartilage due to frequent or repeated coughing or overwork. ? Fracture to one or more ribs. ? Sore cartilage due to inflammation (costochondritis).  What increases the risk? Risk factors for this condition  may include:  Activities that increase your risk for trauma or injury to your chest.  Respiratory infections or conditions that cause frequent coughing.  Medical conditions or overeating that can cause heartburn.  Heart disease or family history of heart disease.  Conditions or health behaviors that increase your risk of developing a blood clot.  Having had chicken pox (varicella zoster).  What are the signs or symptoms? Chest pain can feel like:  Burning or tingling on the surface of your chest or deep in your chest.  Crushing, pressure, aching, or squeezing pain.  Dull or sharp pain that is worse when you move, cough, or take a deep breath.  Pain that is also felt in your back, neck, shoulder, or arm, or pain that spreads to any of these areas.  Your chest pain may come and go, or it may stay constant. How is this diagnosed? Lab tests or other studies may be needed to find the cause of your pain. Your health care provider may have you take a test called an ECG (electrocardiogram). An ECG records your heartbeat patterns at the time the test is performed. You may also have other tests, such as:  Transthoracic echocardiogram (TTE). In this test, sound waves are used to create a picture of the heart structures and to look at how blood flows through your heart.  Transesophageal echocardiogram (TEE).This is a more advanced imaging test that takes images from inside your body. It allows your health care provider to see your heart in finer detail.  Cardiac monitoring. This allows your health care provider to   monitor your heart rate and rhythm in real time.  Holter monitor. This is a portable device that records your heartbeat and can help to diagnose abnormal heartbeats. It allows your health care provider to track your heart activity for several days, if needed.  Stress tests. These can be done through exercise or by taking medicine that makes your heart beat more quickly.  Blood  tests.  Other imaging tests.  How is this treated? Treatment depends on what is causing your chest pain. Treatment may include:  Medicines. These may include: ? Acid blockers for heartburn. ? Anti-inflammatory medicine. ? Pain medicine for inflammatory conditions. ? Antibiotic medicine, if an infection is present. ? Medicines to dissolve blood clots. ? Medicines to treat coronary artery disease (CAD).  Supportive care for conditions that do not require medicines. This may include: ? Resting. ? Applying heat or cold packs to injured areas. ? Limiting activities until pain decreases.  Follow these instructions at home: Medicines  If you were prescribed an antibiotic, take it as told by your health care provider. Do not stop taking the antibiotic even if you start to feel better.  Take over-the-counter and prescription medicines only as told by your health care provider. Lifestyle  Do not use any products that contain nicotine or tobacco, such as cigarettes and e-cigarettes. If you need help quitting, ask your health care provider.  Do not drink alcohol.  Make lifestyle changes as directed by your health care provider. These may include: ? Getting regular exercise. Ask your health care provider to suggest some activities that are safe for you. ? Eating a heart-healthy diet. A registered dietitian can help you to learn healthy eating options. ? Maintaining a healthy weight. ? Managing diabetes, if necessary. ? Reducing stress, such as with yoga or relaxation techniques. General instructions  Avoid any activities that bring on chest pain.  If heartburn is the cause for your chest pain, raise (elevate) the head of your bed about 6 inches (15 cm) by putting blocks under the legs. Sleeping with more pillows does not effectively relieve heartburn because it only changes the position of your head.  Keep all follow-up visits as told by your health care provider. This is important.  This includes any further testing if your chest pain does not go away. Contact a health care provider if:  Your chest pain does not go away.  You have a rash with blisters on your chest.  You have a fever.  You have chills. Get help right away if:  Your chest pain is worse.  You have a cough that gets worse, or you cough up blood.  You have severe pain in your abdomen.  You have severe weakness.  You faint.  You have sudden, unexplained chest discomfort.  You have sudden, unexplained discomfort in your arms, back, neck, or jaw.  You have shortness of breath at any time.  You suddenly start to sweat, or your skin gets clammy.  You feel nauseous or you vomit.  You suddenly feel light-headed or dizzy.  Your heart begins to beat quickly, or it feels like it is skipping beats. These symptoms may represent a serious problem that is an emergency. Do not wait to see if the symptoms will go away. Get medical help right away. Call your local emergency services (911 in the [place] Do not drive yourself to the hospital. This information is not intended to replace advice given to you by your health care provider. Make sure you   discuss any questions you have with your health care provider. Document Released: 02/14/2005 Document Revised: 01/30/2016 Document Reviewed: 01/30/2016 Elsevier Interactive Patient Education  2017 Elsevier Inc.  

## 2017-03-26 NOTE — Addendum Note (Signed)
Addended by: Sherren MochaSHAW, Salihah Peckham N on: 03/26/2017 02:36 PM   Modules accepted: Orders

## 2017-03-26 NOTE — Progress Notes (Deleted)
Subjective:    Patient ID: Julie Boyd, female    DOB: May 20, 1976, 41 y.o.   MRN: [id number]  HPI 41 yo woman here for her CPE. I first met this pt 1 wk prior when she presented with atypical left sided chest pain. We did not find any etiology on exam but it was self-resolving, as it had the one time it occurred prior. She did have an abnml EKG with flipped T waves diffusely - I had the DOD at Cardiology review her EKG w/ agreed with non-urgent cardiology referral for further eval which was placed. No anxiety, no GERD, no asthma, seasonal allergies well controlled. Non-exertional - is a runner. Peak flow was excellent w/ no response to albuterol. CXR nml. CBC nml   Primary Preventative Screenings: Cervical Cancer: Followed by Dr. Delsa Bern Family Planning: Followed by Dr. Katharine Look Rivard STI screening:Followed by Dr. Katharine Look Rivard Breast Cancer:Followed by Dr. Katharine Look Rivard Colorectal Cancer: NA due to age Tobacco use/EtOH/substances: Bone Density: Cardiac: EKG abnml w/ flipped T waves done 03/21/17 -repeat today, cardiology referral P. Lipids great 04/2015 Weight/Blood sugar/Diet/Exercise: BMI Readings from Last 3 Encounters:  03/21/17 29.62 kg/m  12/12/16 30.73 kg/m  05/05/15 30.27 kg/m   OTC/Vit/Supp/Herbal: Dentist/Optho: Immunizations:  Immunization History  Administered Date(s) Administered  . Influenza Split 05/25/2013  . Influenza,inj,Quad PF,6+ Mos 05/05/2015, 03/15/2017  . Tdap 09/17/2013     Chronic Medical Conditions: Seasonal allergies: Seen prior by Dr. Ernst Bowler - lasts visit 11/2016   Review of Systems     Objective:   Physical Exam       BP 100/62   Pulse 70   Temp 97.8 F (36.6 C)   Resp 16   Ht 5' 0.5" (1.537 m)   Wt 153 lb (69.4 kg)   LMP 03/14/2017   SpO2 99%   BMI 29.39 kg/m   Assessment & Plan:  EKG Ua, cmp, lipid, tsh, esr/crp if pt is still having CP  1. Annual physical exam   2. Screening for cardiovascular,  respiratory, and genitourinary diseases   3. Screening for deficiency anemia   4. Screening for thyroid disorder   5. Nonspecific abnormal electrocardiogram (ECG) (EKG)   6. Atypical chest pain   7. Chest wall tenderness   8. Encounter for vitamin deficiency screening     Orders Placed This Encounter  Procedures  . Comprehensive metabolic panel    Order Specific Question:   Has the patient fasted?    Answer:   Yes  . Lipid panel    Order Specific Question:   Has the patient fasted?    Answer:   Yes  . TSH  . Sedimentation Rate  . C-reactive protein  . VITAMIN D 25 Hydroxy (Vit-D Deficiency, Fractures)  . VITAMIN D 25 Hydroxy (Vit-D Deficiency, Fractures)  . C-reactive protein  . Ambulatory referral to Sports Medicine    Referral Priority:   Routine    Referral Type:   Consultation    Number of Visits Requested:   1  . POCT urinalysis dipstick  . EKG 12-Lead    Meds ordered this encounter  Medications  . meloxicam (MOBIC) 15 MG tablet    Sig: Take 1 tablet (15 mg total) daily by mouth.    Dispense:  30 tablet    Refill:  1    I personally performed the services described in this documentation, which was scribed in my presence. The recorded information has been reviewed and considered, and addended by me as needed.  Delman Cheadle, M.D.  Primary Care at [facility] [address] [place], [place] [address] [phone] phone [phone] fax  03/29/17 4:18 PM

## 2017-03-27 ENCOUNTER — Ambulatory Visit (INDEPENDENT_AMBULATORY_CARE_PROVIDER_SITE_OTHER): Payer: BLUE CROSS/BLUE SHIELD | Admitting: Family Medicine

## 2017-03-27 ENCOUNTER — Encounter: Payer: Self-pay | Admitting: Family Medicine

## 2017-03-27 ENCOUNTER — Other Ambulatory Visit: Payer: Self-pay

## 2017-03-27 VITALS — BP 100/62 | HR 70 | Temp 97.8°F | Resp 16 | Ht 60.5 in | Wt 153.0 lb

## 2017-03-27 DIAGNOSIS — Z136 Encounter for screening for cardiovascular disorders: Secondary | ICD-10-CM

## 2017-03-27 DIAGNOSIS — Z1329 Encounter for screening for other suspected endocrine disorder: Secondary | ICD-10-CM | POA: Diagnosis not present

## 2017-03-27 DIAGNOSIS — Z130 Encounter for screening for diseases of the blood and blood-forming organs and certain disorders involving the immune mechanism: Secondary | ICD-10-CM

## 2017-03-27 DIAGNOSIS — Z1389 Encounter for screening for other disorder: Secondary | ICD-10-CM | POA: Diagnosis not present

## 2017-03-27 DIAGNOSIS — Z0000 Encounter for general adult medical examination without abnormal findings: Secondary | ICD-10-CM

## 2017-03-27 DIAGNOSIS — R0789 Other chest pain: Secondary | ICD-10-CM

## 2017-03-27 DIAGNOSIS — Z1383 Encounter for screening for respiratory disorder NEC: Secondary | ICD-10-CM

## 2017-03-27 DIAGNOSIS — Z1321 Encounter for screening for nutritional disorder: Secondary | ICD-10-CM | POA: Diagnosis not present

## 2017-03-27 DIAGNOSIS — R9431 Abnormal electrocardiogram [ECG] [EKG]: Secondary | ICD-10-CM | POA: Diagnosis not present

## 2017-03-27 LAB — POCT URINALYSIS DIP (MANUAL ENTRY)
Bilirubin, UA: NEGATIVE
Blood, UA: NEGATIVE
GLUCOSE UA: NEGATIVE mg/dL
Ketones, POC UA: NEGATIVE mg/dL
Nitrite, UA: NEGATIVE
PROTEIN UA: NEGATIVE mg/dL
SPEC GRAV UA: 1.02 (ref 1.010–1.025)
UROBILINOGEN UA: 0.2 U/dL
pH, UA: 6 (ref 5.0–8.0)

## 2017-03-27 MED ORDER — MELOXICAM 15 MG PO TABS: 15.0000 mg | ORAL_TABLET | Freq: Every day | ORAL | 1 refills | Status: DC

## 2017-03-27 NOTE — Progress Notes (Addendum)
Subjective:  By signing my name below, I, Julie Boyd, attest that this documentation has been prepared under the direction and in the presence of Julie Cheadle, MD Electronically Signed: Ladene Boyd, ED Scribe 03/27/2017 at 10:05 AM.   Patient ID: Julie Boyd, female    DOB: Oct 05, 1975, 41 y.o.   MRN: [id number]  Chief Complaint  Patient presents with  . Annual Exam   HPI 41 yo woman here for her CPE. I first met this pt 1 wk prior when she presented with atypical left sided chest pain. We did not find any etiology on exam but it was self-resolving, as it had the one time it occurred prior. She did have an abnml EKG with flipped T waves diffusely - I had the DOD at Cardiology review her EKG w/ agreed with non-urgent cardiology referral for further eval which was placed. No anxiety, no GERD, no asthma, seasonal allergies well controlled. Non-exertional - is a runner. Peak flow was excellent w/ no response to albuterol. CXR nml. CBC nml   Pt states she is still having non-radiating L sided chest pain that she describes as aching and heaviness. Noticed chest pain worsened 4 days ago which required her to lay down but did not improve chest pain. States she notices weakness in her L upper extremity with palpation of the L chest wall. No medications tried PTA for pain. She has not tried Meloxicam in the past. Pt does reports that she had a positive TB skin test at age [age] which was treated with antibiotics but has had normal CXRs since.   Primary Preventative Screenings: Cervical Cancer: Followed by Dr. Delsa Boyd; last pap in June Family Planning: Followed by Dr. Katharine Look Boyd STI screening:Followed by Dr. Katharine Look Boyd Breast Cancer:Followed by Dr. Katharine Look Boyd Colorectal Cancer: NA due to age Tobacco use/EtOH/substances: Denies EtOH, tobacco use or exposure to smoke Bone Density:  Cardiac: EKG abnml w/ flipped T waves done 03/21/17 -repeat today, cardiology referral P. Lipids  great 04/2015 Weight/Blood sugar/Diet/Exercise: BMI Readings from Last 3 Encounters:  03/27/17 29.39 kg/m  03/21/17 29.62 kg/m  12/12/16 30.73 kg/m   OTC/Vit/Supp/Herbal: No longer taking Calcium or Vitamin D, stopped 3 years ago. Does not consume much milk. Reports [place] a lot of time at the park. Dentist/Optho: Immunizations:  Immunization History  Administered Date(s) Administered  . Influenza Split 05/25/2013  . Influenza,inj,Quad PF,6+ Mos 05/05/2015, 03/15/2017  . Tdap 09/17/2013    Chronic Medical Conditions: Seasonal allergies: Seen prior by Dr. Ernst Boyd - lasts visit 11/2016  Past Medical History:  Diagnosis Date  . Allergy   . Elderly multigravida 11/29/2013  . First trimester bleeding 11/29/2013  . Medical history non-contributory   . Vaginal delivery 11/29/2013   Past Surgical History:  Procedure Laterality Date  . Polyp removal     uterine polyp   Current Outpatient Medications on File Prior to Visit  Medication Sig Dispense Refill  . Multiple Vitamins-Minerals (MULTIVITAMIN ADULT EXTRA C PO) multivitamin    . valACYclovir (VALTREX) 1000 MG tablet valacyclovir 1 gram tablet  TAKE 1 TABLET BY MOUTH EVERY 12 HOURS FOR 5 DAYS     No current facility-administered medications on file prior to visit.    Allergies  Allergen Reactions  . Sulfa Antibiotics Rash  . Aleve [Naproxen] Rash    Pt says she can take Ibuprofen without a problem   Family History  Problem Relation Age of Onset  . Diabetes Father   . Cancer Maternal Grandfather   .  Asthma Son   . Allergic rhinitis Neg Hx   . Angioedema Neg Hx   . Eczema Neg Hx   . Immunodeficiency Neg Hx   . Urticaria Neg Hx    Social History   Socioeconomic History  . Marital status: Significant Other    Spouse name: Julie Boyd  . Number of children: 3  . Years of education: 12+  . Highest education level: None  Social Needs  . Financial resource strain: None  . Food insecurity - worry: None  .  Food insecurity - inability: None  . Transportation needs - medical: None  . Transportation needs - non-medical: None  Occupational History  . 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 Smoking status: Never Smoker  . Smokeless tobacco: Never Used  Substance and Sexual Activity  . Alcohol use: No    Alcohol/week: 0.0 oz  . Drug use: No  . Sexual activity: Yes    Partners: Male    Birth control/protection: Coitus interruptus  Other Topics Concern  . None  Social History Narrative   Lives with her long-time boyfriend and their 3 sons.   Born in [place]. Came to the [place] in 1994, at age 82.   Depression screen [facility] 2/9 03/21/2017 05/05/2015  Decreased Interest 0 0  Down, Depressed, Hopeless 0 0  PHQ - 2 Score 0 0     Review of Systems  Cardiovascular: Positive for chest pain.  Allergic/Immunologic: Positive for environmental allergies.  Neurological: Positive for weakness (L upper extremity).  All other systems reviewed and are negative.     Objective:   Physical Exam  Constitutional: She is oriented to person, place, and time. She appears well-developed and well-nourished. No distress.  HENT:  Head: Normocephalic and atraumatic.  Right Ear: Tympanic membrane normal.  Left Ear: Tympanic membrane normal.  Nose: Nose normal.  Mouth/Throat: Uvula is midline, oropharynx is clear and moist and mucous membranes are normal.  Eyes: Conjunctivae and EOM are normal.  Neck: Neck supple. No tracheal deviation present. No thyroid mass and no thyromegaly present.  Cardiovascular: Normal rate, regular rhythm, S1 normal, S2 normal and normal heart sounds.  Pulses:      Dorsalis pedis pulses are 2+ on the right side, and 2+ on the left side.  Pulmonary/Chest: Effort normal and breath sounds normal. No respiratory distress.  Lungs clear. Good air movement. Mild symptoms inducement with palpation of the 2nd/3rd costosternal junction.  Abdominal: She  exhibits no mass. There is no hepatosplenomegaly.  Musculoskeletal: Normal range of motion. She exhibits no edema.  Lymphadenopathy:    She has no cervical adenopathy.       Right: No supraclavicular adenopathy present.       Left: No supraclavicular adenopathy present.  Neurological: She is alert and oriented to person, place, and time.  Reflex Scores:      Tricep reflexes are 2+ on the right side and 2+ on the left side.      Bicep reflexes are 2+ on the right side and 2+ on the left side.      Brachioradialis reflexes are 2+ on the right side and 2+ on the left side. Skin: Skin is warm and dry.  Psychiatric: She has a normal mood and affect. Her behavior is normal.  Nursing note and vitals reviewed.  BP 100/62   Pulse 70   Temp 97.8 F (36.6 C)   Resp 16   Ht 5' 0.5" (1.537 m)  Wt 153 lb (69.4 kg)   LMP 03/14/2017   SpO2 99%   BMI 29.39 kg/m     EKG reading done by Julie Cheadle, MD: compared to 03/21/17 shows l EKG: Sinus bradycardia, no acute ischemic changes noted. Lateral chest leads now have appropriate T-waves when compared to prior EKG done 03/21/2017 but other twave inversions on II, III, aVF, and medial chest leads persist.   I have personally reviewed the EKG tracing and agree with the computer interpretation with the exception of Possible  Anteroseptal/anterior ischemia.  Assessment & Plan:   1. Annual physical exam   2. Screening for cardiovascular, respiratory, and genitourinary diseases   3. Screening for deficiency anemia   4. Screening for thyroid disorder   5. Nonspecific abnormal electrocardiogram (ECG) (EKG) - t wave inversion persists - has been referred to [facility] for further eval but I highly doubt that her pain is cardiac or due to EKG abnml.  6. Atypical chest pain - poss that she just has an atypical chest wall strain but I wonder about poss neurogenic thoracic outlet syndrome poss - will refer to sports med for further eval of this since it  has now persistent for almost 2 wks. Start daily meloxicam x 4-6 wks.  7. Chest wall tenderness   8. Encounter for vitamin deficiency screening     Orders Placed This Encounter  Procedures  . Comprehensive metabolic panel    Order Specific Question:   Has the patient fasted?    Answer:   Yes  . Lipid panel    Order Specific Question:   Has the patient fasted?    Answer:   Yes  . TSH  . Sedimentation Rate  . C-reactive protein  . VITAMIN D 25 Hydroxy (Vit-D Deficiency, Fractures)  . VITAMIN D 25 Hydroxy (Vit-D Deficiency, Fractures)  . C-reactive protein  . Ambulatory referral to Sports Medicine    Referral Priority:   Routine    Referral Type:   Consultation    Number of Visits Requested:   1  . POCT urinalysis dipstick  . EKG 12-Lead    Meds ordered this encounter  Medications  . meloxicam (MOBIC) 15 MG tablet    Sig: Take 1 tablet (15 mg total) daily by mouth.    Dispense:  30 tablet    Refill:  1    I personally performed the services described in this documentation, which was scribed in my presence. The recorded information has been reviewed and considered, and addended by me as needed.   Julie Boyd, M.D.  Primary Care at [facility] [address] [place], [place] [address] [phone] phone [phone] fax  03/29/17 4:19 PM

## 2017-03-27 NOTE — Patient Instructions (Addendum)
I am wondering if you could have a neurogenic thoracic outlet syndrome so have referred you to sports medicine for further evaluation of this possibility.  You should get a call from [facility] to schedule for evaluation of below as a possible cause of your pain.  To evaluate your abnormal EKG, I have referred you to  [facility] Address: [address] [place], [place], [place] [address] Phone: [phone]     IF you received an x-ray today, you will receive an invoice from [facility]. Please contact [facility] at [phone] with questions or concerns regarding your invoice.   IF you received labwork today, you will receive an invoice from [facility]. Please contact [facility] at [phone] with questions or concerns regarding your invoice.   Our billing staff will not be able to assist you with questions regarding bills from these companies.  You will be contacted with the lab results as soon as they are available. The fastest way to get your results is to activate your My Chart account. Instructions are located on the last page of this paperwork. If you have not heard from [place] regarding the results in 2 weeks, please contact this office.    Thoracic Outlet Syndrome Thoracic outlet syndrome (TOS) is a group of signs and symptoms that result when the vein, artery, or nerves that supply your arm and hand are squeezed (compressed). To reach your arm, all of these have to pass through a tight space under your collarbone and above your top rib (thoracic outlet). There are three types of TOS:  Compression of the nerves that supply your arm and hand is called neurogenic TOS. Most people with TOS have this type.  Compression of the vein that returns blood from your arm and hand (subclavian vein) is called venous TOS.  Compression of the artery that carries blood to your arm and hand (subclavian artery) is called arterial TOS. Arterial TOS is the rarest  type.  Depending on which structures are affected, you may have symptoms on one side or both sides of your body. What are the causes? Neurogenic TOS may be caused by swelling or scarring in your neck muscles that results in the narrowing of your thoracic outlet. This leads to nerve compression. It can happen from:  Neck injuries from an auto accident (whiplash).  Falls.  Repetitive stress on your neck from working with your arms. This stress could be from using a keyboard all day or working on an assembly line.  Venous TOS may be caused by doing hard work with your arms, especially if you have to lift your arms above your head. A blood clot may form in the vein. Arterial TOS may be caused by having an extra rib at the base of your neck (cervical rib). This rib presses on your subclavian artery. Over time, this pressure may cause a clot to form inside the artery, or the artery may weaken and balloon outward (aneurysm). What increases the risk?  You may be at greater risk for neurogenic TOS after a neck injury or repetitive stress on your neck.  You may be at greater risk for venous TOS if you do strenuous and repetitive work with your arms.  You may be at greater risk for arterial TOS if you were born with a cervical rib. Risk factors for any type of TOS include:  Being female.  Being overweight.  Having poor posture.  What are the signs or symptoms? Your signs and symptoms will  depend on the type of TOS that you have. Signs and symptoms of neurogenic TOS may include:  Pain in your shoulder, arm, or hand.  Tingling or numbness in your shoulder, arm, or hand.  Tiredness or weakness of your shoulder, arm, or hand.  Neck pain.  Headache.  Signs and symptoms of venous TOS may include:  Pain and swelling of your whole arm.  Arm skin that is darker than usual.  Signs and symptoms of arterial TOS may include:  Pain and cramps in your arm or hand.  Pale arm skin.  Very  cold hands.  All signs and symptoms of TOS may be worse when you hold your arms over your head. How is this diagnosed? Your health care provider may suspect TOS from your symptoms. A physical exam will be done. During the exam, your health care provider may ask you to hold your arms over your head to check whether your symptoms get worse. Tests may also be done to confirm the diagnosis and to find out what is causing TOS. These may include:  Imaging studies, such as: ? X-rays to look for a cervical rib. ? A test using sound waves to create an image (ultrasound). ? CT scan. ? MRI.  A test that involves measuring and recording the pulses in your wrists (pulse volume recording).  A test that involves measuring the conduction speed of nerve impulses in your arm (nerve conduction velocity test).  A test in which X-rays are done after dye is injected into your subclavian artery or vein (venography or arteriography).  How is this treated? Treatment depends on the type of TOS that you have.  Neurogenic TOS may be treated with: ? Physical therapy to learn stretching exercises and good posture. ? Occupational therapy to improve your workplace and home environment. ? Medicine, including pain medicine, muscle relaxants, and anti-inflammatory medicine. ? Surgery to remove scarred neck muscles or the first rib. This is rarely done for this type of TOS.  Venous TOS may be treated with: ? Medicine, including blood thinners or blood clot dissolvers. ? Surgery to remove a blood clot. ? Surgery to remove the uppermost rib to make more space in the thoracic outlet.  Arterial TOS may be treated with surgery to: ? Remove the cervical rib. ? Remove a blood clot (thrombus). ? Repair an aneurysm.  Follow these instructions at home:  Take medicines only as directed by your health care provider.  Maintain a healthy weight. Lose weight as directed by your health care provider.  Do stretching exercises  at home as directed by your health care provider or physical therapist.  Maintain good posture.  Do not carry heavy bags over your shoulder.  Do not repetitively lift heavy objects over your head.  Take frequent breaks to stretch and rest your arms if you work at a keyboard or do other repetitive work with your hands and arms.  Keep all follow-up visits as directed by your health care provider. This is important. Contact a health care provider if:  You have pain, cramps, numbness, or tingling in your arm or hand.  Your arm or hand frequently feels tired.  Your arm develops a darker skin color than usual.  Your hand feels cold.  You have frequent headaches or neckaches. Get help right away if:  You lose feeling in your arm or hand.  You are unable to move your fingers.  Your fingers turn a dark color. This information is not intended to replace  advice given to you by your health care provider. Make sure you discuss any questions you have with your health care provider. Document Released: 04/27/2002 Document Revised: 10/13/2015 Document Reviewed: 10/07/2013 Elsevier Interactive Patient Education  2018 [employer].  Thoracic Outlet Syndrome Rehab Ask your health care provider which exercises are safe for you. Do exercises exactly as told by your health care provider and adjust them as directed. It is normal to feel mild stretching, pulling, tightness, or discomfort as you do these exercises, but you should stop right away if you feel sudden pain or your pain gets worse.Do not begin these exercises until told by your health care provider. Stretching and range of motion exercises These exercises warm up your muscles and joints and improve the movement and flexibility of your shoulder. These exercises also help to relieve pain, numbness, and tingling. Exercise A: Gentle chest stretch with "belly" breathing 1. On the floor, place a half foam roller or a bath towel that is rolled up  lengthwise. 2. Lie on your back so your spine-all the way from your head to your tailbone-is on top of the foam roller or towel. Relax. You should feel a gentle stretch across your upper chest. ? Keep both feet or heels firmly on the floor. ? You may bend your knees for comfort. ? Let your arms fall naturally to your sides. 3. Breathe deeply and slowly from your belly. You should feel your belly rise each time you breathe in (inhale). Breathe out (exhale) completely before you inhale again. 4. Stay in this position and continue to inhale and exhale for __________ seconds. ? Over time, gradually increase the amount of time that you hold this stretch, as told by your health care provider. Repeat __________ times. Complete this exercise __________ times a day. Exercise B: Scalene stretches There are 3 types of scalene stretches. They vary depending on which direction you are looking while you do the exercise. Your health care provider will tell you which type of scalene stretch to do. 1. Lie on your back. Place the hand from your left / right side under the hip of your left / right side. For example, if your right shoulder is injured, place your right hand under your right hip. 2. Gently and slowly tilt your head toward your healthy shoulder. Follow instructions from your health care provider about which direction you should turn your face while you tilt your head: ? Scalenus posterior stretch: Turn your face toward your healthy shoulder, in the same direction that you tilt your head. ? Scalenus medius stretch: Turn your face toward the ceiling while you tilt your head toward your healthy shoulder. ? Scalenus anterior stretch: Turn your face toward your injured shoulder. This means that you tilt your head one direction (toward your healthy shoulder), but you turn your face to the opposite direction. 3. Hold each stretch for __________ seconds. Repeat __________ times. Complete this exercise __________  times a day. Exercise C: Chin tuck ( axial extension) 1. Using good posture, sit on a stable surface or stand up. If you have trouble keeping good posture, rest your back and head against a stable wall during this exercise. 2. Look straight ahead and slowly move your chin back, toward your neck, until you feel a stretch in the back of your head. ? Your head should slide back. ? Your chin should be slightly lowered. 3. Hold for __________ seconds. 4. Return to the starting position. Repeat __________ times. Complete this exercise __________  times a day. Exercise D: Shoulder rolls  1. Sit in a sturdy chair or stand. Keep good posture during the exercise. 2. Shrug your shoulders up toward your ears and gently move your shoulders around in circles going forward. 3. Reverse the direction of your shoulder rolls so you are moving your shoulders around in circles going backward. Repeat __________ times. Complete this exercise __________ times a day. Exercise E: Chest stretch ( external rotation and abduction) 1. Stand in a doorway with one of your feet slightly in front of the other. This is called a staggered stance. If you cannot reach your forearms to the door frame, do this exercise in a corner of a room. 2. Choose one of the following positions as told by your health care provider: ? Place your hands and forearms on the door frame above your head. ? Place your hands and forearms on the door frame at the height of your head. ? Place your hands on the door frame at the height of your elbows. 3. Slowly move your weight onto your front foot until you feel a stretch across your chest and in the front of your shoulders. Keep your head and chest upright and keep your abdominal muscles tight. 4. Hold for __________ seconds. 5. To release the stretch, shift your weight to your back foot. Repeat __________ times. Complete this stretch __________ times a day Strengthening exercise This exercise builds  strength and endurance in your shoulder. Endurance is the ability to use your muscles for a long time, even after they get tired. Exercise F: Scapular retraction  1. Sit in a stable chair without armrests, or stand. 2. Secure an exercise band to a stable object in front of you so the band is at shoulder height. 3. Hold one end of the band in each hand. Your palms should face down. 4. Straighten your elbows and lift your arms up to shoulder height. 5. Step back, away from the secured end of the band, until the band has no slack. 6. Squeeze your shoulder blades together and pull your elbows back behind you. ? Your elbows should be at about chest or shoulder height. ? Keep your upper arms lifted, away from your sides. 7. Hold for __________ seconds. 8. Slowly return to the starting position. Repeat __________ times. Complete this exercise __________ times a day. Posture and body mechanics Body mechanics refers to the movements and positions of your body while you do your daily activities. Posture is part of body mechanics. Good posture and healthy body mechanics can help to relieve stress in your body's tissues and joints. Good posture means that your spine is in its natural S-curve position (your spine is neutral), your shoulders are pulled back slightly, and your head is not tipped forward. The following are general guidelines for applying improved posture and body mechanics to your everyday activities. Standing   When standing, keep your spine neutral and your feet about hip-width apart. Keep a slight bend in your knees. Your ears, shoulders, and hips should line up with each other.  When you do a task in which you stand in one place for a long time, place one foot up on a stable object that is 2-4 inches (5-10 cm) high, such as a footstool. This helps keep your spine neutral. Sitting   When sitting, keep your spine neutral and your feet flat on the floor. Use a footrest, if necessary, and  keep your thighs parallel to the floor.Avoid rounding your shoulders,  and avoid tilting your head forward.  When working at a desk or a computer, keep your desk at a height where your hands are slightly lower than your elbows. Slide your chair under your desk so you are close enough to maintain good posture.  When working at a computer, place your monitor at a height and where you are looking straight ahead and you do not have to tilt your head forward or downward to look at the screen. Resting   When lying down and resting, avoid positions that are most painful for you.  If you have pain with activities such as sitting, bending, stooping, or squatting (flexion-based activities), lie in a position in which your body does not bend very much. For example, avoid curling up on your side with your arms and knees near your chest (fetal position).  If you have pain with activities such as standing for a long time or reaching with your arms (extension-based activities), lie with your spine in a neutral position and bend your knees slightly. Try the following positions:  Lying on your side with a pillow between your knees.  Lying on your back with a pillow under your knees. This information is not intended to replace advice given to you by your health care provider. Make sure you discuss any questions you have with your health care provider. Document Released: 05/07/2005 Document Revised: 01/12/2016 Document Reviewed: 01/30/2015 Elsevier Interactive Patient Education  2018 [employer] Inc[employer]  Calcium Intake Recommendations Calcium is a mineral that affects many functions in the body, including:  Blood clotting.  Blood vessel function.  Nerve impulse conduction.  Hormone secretion.  Muscle contraction.  Bone and teeth functions.  Most of your body's calcium supply is stored in your bones and teeth. When your calcium stores are low, you may be at risk for low bone mass, bone loss, and bone  fractures. Consuming enough calcium helps to grow healthy bones and teeth and to prevent breakdown over time. It is very important that you get enough calcium if you are:  A child undergoing rapid growth.  An adolescent girl.  A pre- or post-menopausal woman.  A woman whose menstrual cycle has stopped due to anorexia nervosa or regular intense exercise.  An individual with lactose intolerance or a milk allergy.  A vegetarian.  What is my plan? Try to consume the recommended amount of calcium daily based on your age. Depending on your overall health, your health care provider may recommend increased calcium intake.General daily calcium intake recommendations by age are:  Birth to 6 months: 200 mg.  Infants 7 to 12 months: 260 mg.  Children 1 to 3 years: 700 mg.  Children 4 to 8 years: 1,000 mg.  Children 9 to 13 years: 1,300 mg.  Teens 14 to 18 years: 1,300 mg.  Adults 19 to 50 years: 1,000 mg.  Adult women 51 to 70 years: 1,200 mg.  Adult men 51 to 70 years: 1,000 mg.  Adults 71 years and older: 1,200 mg.  Pregnant and breastfeeding teens: 1,300 mg.  Pregnant and breastfeeding adults: 1,000 mg.  What do I need to know about calcium intake?  In order for the body to absorb calcium, it needs vitamin D. You can get vitamin D through: ? Direct exposure of the skin to sunlight. ? Foods, such as egg yolks, liver, saltwater fish, and fortified milk. ? Supplements.  Consuming too much calcium may cause: ? Constipation. ? Decreased absorption of iron and zinc. ? Kidney stones.  Calcium supplements may interact with certain medicines. Check with your health care provider before starting any calcium supplements.  Try to get most of your calcium from food. What foods can I eat? Grains  Fortified oatmeal. Fortified ready-to-eat cereals. Fortified frozen waffles. Vegetables Turnip greens. Broccoli. Fruits Fortified orange juice. Meats and Other Protein  Sources Canned sardines with bones. Canned salmon with bones. Soy beans. Tofu. Baked beans. Almonds. [place] nuts. Sunflower seeds. Dairy Milk. Yogurt. Cheese. Cottage cheese. Beverages Fortified soy milk. Fortified rice milk. Sweets/Desserts Pudding. Ice Cream. Milkshakes. Blackstrap molasses. The items listed above may not be a complete list of recommended foods or beverages. Contact your dietitian for more options. What foods can affect my calcium intake? It may be more difficult for your body to use calcium or calcium may leave your body more quickly if you consume large amounts of:  Sodium.  Protein.  Caffeine.  Alcohol.  This information is not intended to replace advice given to you by your health care provider. Make sure you discuss any questions you have with your health care provider. Document Released: 12/20/2003 Document Revised: 11/25/2015 Document Reviewed: 10/13/2013 Elsevier Interactive Patient Education  2018 [employer].

## 2017-03-28 LAB — COMPREHENSIVE METABOLIC PANEL
A/G RATIO: 2.2 (ref 1.2–2.2)
ALBUMIN: 4.8 g/dL (ref 3.5–5.5)
ALK PHOS: 46 IU/L (ref 39–117)
ALT: 10 IU/L (ref 0–32)
AST: 15 IU/L (ref 0–40)
BILIRUBIN TOTAL: 0.5 mg/dL (ref 0.0–1.2)
BUN / CREAT RATIO: 15 (ref 9–23)
BUN: 10 mg/dL (ref 6–20)
CHLORIDE: 108 mmol/L — AB (ref 96–106)
CO2: 21 mmol/L (ref 20–29)
Calcium: 9.4 mg/dL (ref 8.7–10.2)
Creatinine, Ser: 0.68 mg/dL (ref 0.57–1.00)
GFR calc non Af Amer: 111 mL/min/{1.73_m2} (ref >59)
GFR, EST AFRICAN AMERICAN: 127 mL/min/{1.73_m2} (ref >59)
GLUCOSE: 89 mg/dL (ref 65–99)
Globulin, Total: 2.2 g/dL (ref 1.5–4.5)
POTASSIUM: 4.8 mmol/L (ref 3.5–5.2)
Sodium: 142 mmol/L (ref 134–144)
TOTAL PROTEIN: 7 g/dL (ref 6.0–8.5)

## 2017-03-28 LAB — LIPID PANEL
CHOLESTEROL TOTAL: 167 mg/dL (ref 100–199)
Chol/HDL Ratio: 2.7 ratio (ref 0.0–4.4)
HDL: 62 mg/dL (ref >39)
LDL Calculated: 88 mg/dL (ref 0–99)
Triglycerides: 85 mg/dL (ref 0–149)
VLDL Cholesterol Cal: 17 mg/dL (ref 5–40)

## 2017-03-28 LAB — C-REACTIVE PROTEIN: CRP: 1.6 mg/L (ref 0.0–4.9)

## 2017-03-28 LAB — VITAMIN D 25 HYDROXY (VIT D DEFICIENCY, FRACTURES): Vit D, 25-Hydroxy: 32.1 ng/mL (ref 30.0–100.0)

## 2017-03-28 LAB — TSH: TSH: 2.55 u[IU]/mL (ref 0.450–4.500)

## 2017-03-28 LAB — SEDIMENTATION RATE: SED RATE: 2 mm/h (ref 0–32)

## 2017-03-29 ENCOUNTER — Encounter: Payer: Self-pay | Admitting: Family Medicine

## 2017-03-29 ENCOUNTER — Ambulatory Visit: Payer: BLUE CROSS/BLUE SHIELD | Admitting: Family Medicine

## 2017-03-29 VITALS — BP 104/72 | Ht 60.0 in | Wt 153.0 lb

## 2017-03-29 DIAGNOSIS — S29011A Strain of muscle and tendon of front wall of thorax, initial encounter: Secondary | ICD-10-CM | POA: Diagnosis not present

## 2017-03-29 NOTE — Progress Notes (Signed)
  Julie Boyd - 41 y.o. female MRN [id number]  Date of birth: 10/24/1975    SUBJECTIVE:      Chief Complaint:/ HPI:   Left-sided anterior chest pain for 3 weeks. Started while she was eating. Has been constant since. Occasionally sharp but always present, always aching. Does not change with activity, no change with respiration. Had something similar several months ago but it only lasted about a day. She is right-hand dominant.  She has 3 children age ranges from 39 years old to 39 years old, all boys. She does not work outside the home.  She is active, has started back running in the last 2 months. Gradually increasing her distance. She typically pulls her 39-year-old behind her when she's running. She tried using a stroller that she pushed, but it gave her some right-sided shoulder pain. That resolved. Since she started running she has lost 10 pounds. She plans to continue running as she loves it.   ROS:     No unusual weight change, in factshe's lost 10 pounds since she started exercising  and she's very pleased about that. No shortness of breath. No heart palpitations. No anxiety or depression symptoms. No nausea, no vomiting. No back pain  PERTINENT  PMH / PSH FH / / SH:  Past Medical, Surgical, Social, and Family History Reviewed & Updated in the EMR.  Pertinent findings include:  History of positive PPD HSV infection history Spondylolisthesis at L5-S1  Workup to date: Chest x-ray which was negative. I personally reviewed the images from x-ray 03/21/2017. EKG 03/27/2017: Normal sinus rhythm. T-wave inversions V1 through V4. No ST-T elevation. Normal axis. No hypertrophy.  I suspect this is normal variant. She is scheduled to see a cardiologist however. She reports gynecologist did breast exam and found no suspicious or worrisome masses.  OBJECTIVE: BP 104/72   Ht 5' (1.524 m)   Wt 153 lb (69.4 kg)   LMP 03/14/2017   BMI 29.88 kg/m   Physical Exam:  Vital signs are reviewed   Full range of motion and full strength in all planes the rotator cuff on both sides.  Specifically she has no pain with liftoff test on the left.  Bilaterally: the Biceps tendon is nontender to palpation; Acromioclavicular joint is nontender to palpation; Scapular motion is symmetrical. CHEST: She is tender to palpation over the mid section of the LEFT pectoralis minor muscle  and this reproduces her pain. RESPIRATORY: normal respiratory effort. ABDOMEN: Soft, nontender. PSYCHIATRIC: Alert oriented 4. Affects interactive. No agitation, no psychomotor retardation.  VASCULAR: Radial pulses 2+ bilaterally symmetrical. NegativeAdson's test.  ASSESSMENT & PLAN:  #1. Pectoralis minor muscle strain on the left. After we discussed, it seems fairly clear that this is because she's now running longer distances and she is pulling her 39-year-old he weighs 30 pounds behind her in a stroller. She has stopped pushing him because it caused some right shoulder issues. She may also have some contribution from postural issues but I think this is the main source of her discomfort. We'll put her on some pectoralis muscle strengthening exercises and some general shoulder strengthening exercises and she will follow-up when necessary. This is not thoracic outlet syndrome. Discussed in detail, spending > 50% of 35 minute office visit in counseling and education regarding these issues,.

## 2017-04-22 ENCOUNTER — Telehealth: Payer: Self-pay | Admitting: Family Medicine

## 2017-04-22 ENCOUNTER — Ambulatory Visit: Payer: BLUE CROSS/BLUE SHIELD | Admitting: Family Medicine

## 2017-04-22 NOTE — Telephone Encounter (Signed)
Called pt - she had to leave today before being seen as I was running >1 hr behind. LVM at [phone] - asking how she was doing, was the meloxicam helping? Did she see a cardiologist at [facility]? Is there anything we can help w/? Any questions or concerns? Can send me a MyChart email or call to office to leave a message with when would be a good time to reach her if she would like a call back to discuss further.

## 2017-04-26 DIAGNOSIS — R9431 Abnormal electrocardiogram [ECG] [EKG]: Secondary | ICD-10-CM | POA: Diagnosis not present

## 2017-05-15 ENCOUNTER — Encounter: Payer: Self-pay | Admitting: Family Medicine

## 2017-05-15 DIAGNOSIS — R9431 Abnormal electrocardiogram [ECG] [EKG]: Secondary | ICD-10-CM | POA: Diagnosis not present

## 2017-07-04 ENCOUNTER — Encounter: Payer: Self-pay | Admitting: Family Medicine

## 2017-07-04 ENCOUNTER — Ambulatory Visit: Payer: BLUE CROSS/BLUE SHIELD | Admitting: Family Medicine

## 2017-07-04 ENCOUNTER — Ambulatory Visit (INDEPENDENT_AMBULATORY_CARE_PROVIDER_SITE_OTHER): Payer: BLUE CROSS/BLUE SHIELD

## 2017-07-04 ENCOUNTER — Other Ambulatory Visit: Payer: Self-pay

## 2017-07-04 VITALS — BP 108/72 | HR 56 | Temp 98.0°F | Resp 16 | Ht 61.02 in | Wt 148.8 lb

## 2017-07-04 DIAGNOSIS — M79661 Pain in right lower leg: Secondary | ICD-10-CM

## 2017-07-04 MED ORDER — MELOXICAM 15 MG PO TABS: 15.0000 mg | ORAL_TABLET | Freq: Every day | ORAL | 1 refills | Status: DC

## 2017-07-04 NOTE — Progress Notes (Signed)
Subjective:  This chart was scribed for Sherren MochaShaw, Amante Fomby N, MD by Veverly FellsHatice Demirci,scribe, at Primary Care at [facility].  This patient was seen in room 2 and the patient's care was started at 12:05 PM.   Chief Complaint  Patient presents with  . Leg Pain    pt states she has been having pain in her right calf x 3 weeks, pt states the pain is sharp      Patient ID: Julie Boyd, female    DOB: December 14, 1975, 42 y.o.   MRN: [id number]  HPI HPI Comments: Julie Boyd is a 42 y.o. female who presents to [facility] at [facility] for right sided sharp shin pain onset three weeks ago. The pain initially started with a burning sensation which she ignored but then felt that it increased. Patient is a runner and took a break due to her symptoms. She also took motrin/ibuprofen which mildly alleviated it, but states that the sensation was still there.  Last night, patient was sleeping and she could feel a shooting pain up her leg.  When she takes a step to put her foot on the ground, she can feel the pain more intensely. Patient denies any recent injury or bruising to the area. She does have two new running shoes which she has been using. Patient has been running   Patient states that her chest wall is doing "much better".  She has been doing her stretches and states that she still has some discomfort occasionally. She was told that she has low voltage from the cardiologist and is wondering what this may mean.   Past Medical History:  Diagnosis Date  . Allergy   . Elderly multigravida 11/29/2013  . First trimester bleeding 11/29/2013  . Medical history non-contributory   . Vaginal delivery 11/29/2013    Current Outpatient Medications on File Prior to Visit  Medication Sig Dispense Refill  . Multiple Vitamins-Minerals (MULTIVITAMIN ADULT EXTRA C PO) multivitamin    . valACYclovir (VALTREX) 1000 MG tablet valacyclovir 1 gram tablet  TAKE 1 TABLET BY MOUTH EVERY 12 HOURS FOR 5 DAYS     No current  facility-administered medications on file prior to visit.     Allergies  Allergen Reactions  . Sulfa Antibiotics Rash  . Aleve [Naproxen] Rash    Pt says she can take Ibuprofen without a problem   Past Surgical History:  Procedure Laterality Date  . Polyp removal     uterine polyp   Family History  Problem Relation Age of Onset  . Diabetes Father   . Cancer Maternal Grandfather   . Asthma Son   . Allergic rhinitis Neg Hx   . Angioedema Neg Hx   . Eczema Neg Hx   . Immunodeficiency Neg Hx   . Urticaria Neg Hx    Social History   Socioeconomic History  . Marital status: Significant Other    Spouse name: [name]  . Number of children: 3  . Years of education: 12+  . Highest education level: None  Social Needs  . Financial resource strain: None  . Food insecurity - worry: None  . Food insecurity - inability: None  . Transportation needs - medical: None  . Transportation needs - non-medical: None  Occupational History  . 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 Smoking status: Never Smoker  . Smokeless tobacco: Never Used  Substance and Sexual Activity  . Alcohol use: No    Alcohol/week: 0.0 oz  .  Drug use: No  . Sexual activity: Yes    Partners: Male    Birth control/protection: Coitus interruptus  Other Topics Concern  . None  Social History Narrative   Lives with her long-time boyfriend and their 3 sons.   Born in [place]. Came to the [place] in 1994, at age 22.   Depression screen [facility] 2/9 07/04/2017 03/21/2017 05/05/2015  Decreased Interest 0 0 0  Down, Depressed, Hopeless 0 0 0  PHQ - 2 Score 0 0 0    Review of Systems  Constitutional: Negative for chills and fever.  Respiratory: Negative for cough, choking and shortness of breath.   Gastrointestinal: Negative for nausea and vomiting.  Musculoskeletal: Negative for neck pain and neck stiffness.       Shin pain  Neurological: Negative for speech difficulty.        Objective:   Physical Exam  Constitutional: She is oriented to person, place, and time. She appears well-developed and well-nourished. No distress.  HENT:  Head: Normocephalic and atraumatic.  Cardiovascular:  No lower extremity edema  Pulmonary/Chest: Effort normal. No respiratory distress.  Musculoskeletal:  Tenderness to palpation along the right distal anterior medial tibia.  No tenderness over the medial and lateral malleolus, achilles is intact.  Positive squeeze.  Neurological: She is alert and oriented to person, place, and time.  Reflex Scores:      Patellar reflexes are 2+ on the right side and 2+ on the left side.      Achilles reflexes are 2+ on the right side and 2+ on the left side. Skin: Skin is warm and dry.  Psychiatric: She has a normal mood and affect. Her behavior is normal.    Vitals:   07/04/17 1123  BP: 108/72  Pulse: (!) 56  Resp: 16  Temp: 98 F (36.7 C)  TempSrc: Oral  SpO2: 99%  Weight: 148 lb 12.8 oz (67.5 kg)  Height: 5' 1.02" (1.55 m)   Dg Tibia/fibula Right  Result Date: 07/04/2017 CLINICAL DATA:  Pain in shin. EXAM: RIGHT TIBIA AND FIBULA - 2 VIEW COMPARISON:  None. FINDINGS: There is no evidence of fracture or other focal bone lesions. Soft tissues are unremarkable. IMPRESSION: Negative. Electronically Signed   By: Signa Kell M.D.   On: 07/04/2017 12:42       Assessment & Plan:   1. Pain in right shin   Suspect shin splints but does have a great deal of focal pain so can't exclude the possibility of a tibial stress fracture that just isn't showing on XR. Pt will stop running x 1 wk - try to take it easy. Start qd meloxicam, ice 20 min tid, and start home stretching shin splints exercises.  If sxs sig improve, gradually increase running while doing home exercises and if sxs recur, refer to PT (or sports med dr.) If sxs do not improve w/in 1 wk and pt is still sig point tender on 3/21 - then should proceed with MRI to check for  stress frx - pt will call to update me on her sxs.   Orders Placed This Encounter  Procedures  . DG Tibia/Fibula Right    Standing Status:   Future    Number of Occurrences:   1    Standing Expiration Date:   07/04/2018    Order Specific Question:   Reason for Exam (SYMPTOM  OR DIAGNOSIS REQUIRED)    Answer:   severe constant point tenderness over anterior medial distal tibia x 3 weeks,  worsening, Runner, nki    Order Specific Question:   Is the patient pregnant?    Answer:   No    Order Specific Question:   Preferred imaging location?    Answer:   External    Meds ordered this encounter  Medications  . meloxicam (MOBIC) 15 MG tablet    Sig: Take 1 tablet (15 mg total) by mouth daily.    Dispense:  30 tablet    Refill:  1    I personally performed the services described in this documentation, which was scribed in my presence. The recorded information has been reviewed and considered, and addended by me as needed.   Norberto Sorenson, M.D.  Primary Care at [facility] [address] [place], [place] [address] [phone] phone [phone] fax  07/07/17 3:22 PM

## 2017-07-04 NOTE — Patient Instructions (Addendum)
Restart the meloxicam daily.     IF you received an x-ray today, you will receive an invoice from [facility]. Please contact [facility] at [phone] with questions or concerns regarding your invoice.   IF you received labwork today, you will receive an invoice from [facility]. Please contact [facility] at [phone] with questions or concerns regarding your invoice.   Our billing staff will not be able to assist you with questions regarding bills from these companies.  You will be contacted with the lab results as soon as they are available. The fastest way to get your results is to activate your My Chart account. Instructions are located on the last page of this paperwork. If you have not heard from [place] regarding the results in 2 weeks, please contact this office.      Shin Splints Shin splints is a painful condition that is felt on the bone that is located in the front of the lower leg (tibia or shin bone) or in the muscles on either side of the bone. This condition happens when physical activities lead to inflammation of the muscles, tendons, and the thin layer that covers the shin bone. It may result from participating in sports or other demanding exercise. What are the causes? This condition may be caused by:  Overuse of muscles.  Repetitive activities.  Flat feet or rigid arches.  Activities that could contribute to shin splints include:  Having a sudden increase in exercise time.  Starting a new, demanding activity.  Running up hills or long distances.  Playing sports that involve sudden starts and stops.  Using a poor warm-up.  Wearing old or worn-out shoes.  What are the signs or symptoms? Symptoms of this condition include:  Pain on the front of the lower leg.  Pain while exercising or at rest.  How is this diagnosed? This condition may be diagnosed based on a physical exam and the history of your symptoms. Your health care provider may  observe you as you walk or run. You may also have X-rays or other imaging tests to rule out other problems that could cause lower leg pain, such as a stress fracture. How is this treated? Treatment for this condition may depend on your age, history, health, and how bad the pain is. Most cases of shin splints can be managed by doing one or more of the following:  Resting.  Reducing the length and intensity of your exercise.  Stopping the activity that causes shin pain.  Taking medicines to control the inflammation.  Icing, massaging, stretching, and strengthening the affected area.  Wearing shoes that have rigid heels, shock absorption, and a good arch support.  Follow these instructions at home: Injury care  If directed, apply ice to the injured area: ? Put ice in a plastic bag. ? Place a towel between your skin and the bag. ? Leave the ice on for 20 minutes, 2-3 times a day.  Massage, stretch, and strengthen the affected area as directed by your health care provider. Activity  Rest as needed. Return to activity gradually as directed by your health care provider.  Restart your exercise sessions with non-weight-bearing exercises, such as cycling or swimming.  Stop running if the pain returns.  Warm up properly before exercising.  Run on a surface that is level and fairly firm.  Gradually change the intensity of an exercise.  If you increase your running distance, add only 5-10% to your distance each week. This means that if you  are running 5 miles this week, you should only increase your run by - mile for next week. General instructions  Take medicines only as directed by your health care provider.  Wear shoes that have rigid heels, shock absorption, and a good arch support.  Change your athletic shoes every 6 months, or every 350-450 miles. Contact a health care provider if:  Your symptoms continue or they worsen even after treatment.  The location, intensity, or  type of pain changes over time.  You have swelling in your lower leg that gets worse.  Your shin becomes red and feels warm. Get help right away if:  You have severe pain.  You have trouble walking. This information is not intended to replace advice given to you by your health care provider. Make sure you discuss any questions you have with your health care provider. Document Released: 05/04/2000 Document Revised: 12/31/2015 Document Reviewed: 05/03/2014 Elsevier Interactive Patient Education  2018 [employer]  [place] [facility] Ask your health care provider which exercises are safe for you. Do exercises exactly as told by your health care provider and adjust them as directed. It is normal to feel mild stretching, pulling, tightness, or discomfort as you do these exercises, but you should stop right away if you feel sudden pain or your pain gets worse.Do not begin these exercises until told by your health care provider. Stretching and range of motion exercise This exercise warms up your muscles and joints and improves the movement and flexibility of your lower leg. This exercise also helps to relieve pain, numbness, and tingling. Exercise A: Calf stretch, standing  1. Stand with the ball of your left / right foot on a step. The ball of your foot is on the walking surface, right under your toes. 2. Keep your other foot firmly on the same step. 3. Hold onto the wall, a railing, or a chair for balance. 4. Slowly lift your other foot, allowing your body weight to press your left / right heel down over the edge of the step. You should feel a stretch in your left / right calf. 5. Hold this position for __________ seconds. 6. Repeat this exercise with a slight bend in your left / right knee. Repeat __________ times with your left / right knee straight and __________ times with your left / right knee bent. Complete this exercise __________ times a day. Strengthening exercises These exercises  build strength and endurance in your lower leg. Endurance is the ability to use your muscles for a long time, even after they get tired. Exercise B: Dorsiflexion  1. Secure a rubber exercise band or tubing to a fixed object, such as a table leg or a pole. 2. Secure the other end of the band around your left / right foot. 3. Sit on the floor, facing the fixed object. The band should be slightly tense when your foot is relaxed. 4. Slowly use your ankle muscles to pull your foot toward you. 5. Hold this position for __________ seconds. 6. Slowly release the tension in the band and return your foot to the starting position. Repeat __________ times. Complete this exercise __________ times a day. Exercise C: Ankle eversion with band 1. Secure one end of a rubber exercise band or tubing to a fixed object, such as a table leg or a pole, that will stay in place when the band is pulled. 2. Loop the other end of the band around the middle of your left / right foot.  3. Sit on the floor, facing the fixed object. The band should be slightly tense when your foot is relaxed. 4. Make fists with your hands and put them between your knees. This will focus your strengthening at your ankle. 5. Leading with your little toe, slowly push your banded foot outward, away from your other leg. Make sure the band is positioned to resist the entire motion. 6. Hold this position for __________ seconds. 7. Control the tension in the band as you slowly return your foot to the starting position. Repeat __________ times. Complete this exercise __________ times a day. Exercise D: Ankle inversion with band 1. Secure one end of a rubber exercise band or tubing to a fixed object, such as a table leg or a pole, that will stay still when the band is pulled. 2. Loop the other end of the band around your left / right foot, just below your toes. 3. Sit on the floor, facing the fixed object. The band should be slightly tense when your foot  is relaxed. 4. Make fists with your hands and put them between your knees. This will focus your strengthening at your ankle. 5. Leading with your big toe, slowly pull your banded foot inward, toward your other leg. Make sure the band is positioned to resist the entire motion. 6. Hold this position for __________ seconds. 7. Control the tension in the band as you slowly return your foot to the starting position. Repeat __________ times. Complete this exercise __________ times a day. Exercise E: Lateral walking with band 1. Stand in a long hallway. 2. Wrap a loop of exercise band around your legs, just above your knees. 3. Bend your knees gently and drop your hips down and back so your weight is over your heels. 4. Step to the side to move down the length of the hallway, keeping your toes pointed forward and keeping tension in the band. 5. Repeat, leading with your other leg. Repeat __________ times. Complete this exercise __________ times a day. Balance exercise This exercise will help improve your control of your foot and ankle when you are standing or walking. Exercise F: Single leg stand 1. Without wearing shoes, stand near a railing or in a doorway. You may hold onto the railing or door frame as needed. 2. Stand on your left / right foot. Keep your big toe down on the floor and try to keep your arch lifted. 3. If this exercise is too easy, you can try doing it with your eyes closed or while standing on a pillow. 4. Hold this position for __________ seconds. Repeat __________ times. Complete this exercise __________ times a day. This information is not intended to replace advice given to you by your health care provider. Make sure you discuss any questions you have with your health care provider. Document Released: 05/07/2005 Document Revised: 01/10/2016 Document Reviewed: 02/03/2015 Elsevier Interactive Patient Education  [employer].

## 2017-07-16 ENCOUNTER — Ambulatory Visit: Payer: Self-pay

## 2017-07-16 NOTE — Telephone Encounter (Signed)
Pt  Was   Seen  On  07/04/2017  By  Dr  Clelia CroftShaw   For Right  Shin  Splints .  She  Is  A  Runner. She  Got an  X  Ray  And  Was  Rx  meloxicam  Pt  Reports   She  Took  Medication    Stopped  Running   Did  [employer]retches   Applied  Ice   And  It  Got  Better.   Today  She  Reports  She  Started  Jogging  at a  [employer]  Pace  And  After  About  5  mins  the  Pain came  Back in her  r  Shin.   She  Continued  To  Walk  For  About   15  mins   Then stopped ,  did  Some  Stretches  And  Applied  Ice  To the  Area and went home.    During the office  Encounter  Patient  Was  Advised  To call  Back  With  Her  Status . She  Was  Advised  To  Avoid impact jogging/ running  Till   Plan of care  Discussed    [phone]  [phone]

## 2017-07-17 NOTE — Telephone Encounter (Signed)
FYI - pt may stop by the office to pick up a ankle/lower leg gel splint this afternoon or tomorrow Thurs 2/28 per my instruction. Thanks!  ------ Was 2 weeks yesterday since she did any exercises - she has been doing rest, ice, meloxicam, shin splints exercises. She started off running slowly after walking for 5 minutes then began slow jogging and she could feel pressure right in that area and then a shooting pain going up into shin.  Pain is initially very focal point - in the bottom half/third of tibia on anterior/medial aspect. Then as it worsens it radiates all over - up, down, around shin. Today it hurts much more than it ever did during any time prior to resting it.  Just walking is killing her. Sig pain w/ putting foot down However, prior to going running yesterday, she was not having any pain at all with her every day activities and walking.   Advise do suspect stress frx and need to resume activity restriction to remain pain free - no running or joggines anticipate 2-3 mos for healing.  Make an appt w/ me w/in the next week - we can repeat xray and discuss whether to proceed w/ MRI or specialty referral after we see those results.  Stop by office today or tomorrow to pick up gel splint - wear at all times but sleep.  Stop stretches and meloxicam (unless needed for pain). Cont ice prn. Start calcium 500mg  tid and vit D 800iu qd.

## 2017-07-17 NOTE — Telephone Encounter (Addendum)
Called Julie Boyd back and LVM. Left my cell phone # for her to return my call to me today on Wed 07/17/2017.   I do suspect she has a stress fracture considering how quickly sxs returned and persisted.   Advise still no running until after repeat eval since pain came back so quickly. Try to avoid ANY activity that induces the pain.  Ice.   Advised pt to come by the office TODAY or TOMORROW 07/18/17 to pick up a leg air/gel splint.  Wearing this cast at all times (except during sleep) can be shown to cut healing time for stress fractures in half.  Then sched an OV w/ me OR WITH DR. Neva SeatGREENE - our sports med dr - sometime 2/28-3/7 to repeat xray then discuss next steps depending on that result and her exam/sxs at that time.  May take up to 8-12 weeks for full healing but there is a regimen of cross training/strengthening that can be done during that time if she is careful. However AVOID just stretching and general leg strengthening exercises for now so she can STOP the shin splint home exercises that I gave her.  Would also be happy to place [facility] where she was seen for her chest wall strain sev mos ago if she prefers.

## 2017-07-23 NOTE — Telephone Encounter (Signed)
Left message to call back per message below

## 2017-09-11 ENCOUNTER — Other Ambulatory Visit: Payer: Self-pay | Admitting: Allergy & Immunology

## 2017-09-11 MED ORDER — LEVOCETIRIZINE DIHYDROCHLORIDE 5 MG PO TABS: 5.0000 mg | ORAL_TABLET | Freq: Every evening | ORAL | 0 refills | Status: DC

## 2017-09-11 MED ORDER — AZELASTINE-FLUTICASONE 137-50 MCG/ACT NA SUSP: 2.0000 | Freq: Two times a day (BID) | NASAL | 0 refills | Status: DC | PRN

## 2017-09-11 NOTE — Telephone Encounter (Signed)
Patient was seen 12-12-16, by Dr. Dellis AnesGallagher. She said she was prescribed citirizine and azelastine. She said they are working well and would like those sent in and is also requesting some eyedrops. [facility] [address].

## 2017-09-11 NOTE — Telephone Encounter (Signed)
Left message advising patient that refill for 30 day supply would be sent and that she would need to be seen in office for further refills.

## 2017-09-27 ENCOUNTER — Ambulatory Visit: Payer: BLUE CROSS/BLUE SHIELD | Admitting: Family Medicine

## 2017-09-27 ENCOUNTER — Encounter: Payer: Self-pay | Admitting: Family Medicine

## 2017-09-27 ENCOUNTER — Encounter

## 2017-09-27 VITALS — BP 100/60 | Ht 60.0 in | Wt 148.0 lb

## 2017-09-27 DIAGNOSIS — S86899A Other injury of other muscle(s) and tendon(s) at lower leg level, unspecified leg, initial encounter: Secondary | ICD-10-CM | POA: Diagnosis not present

## 2017-09-27 NOTE — Progress Notes (Signed)
    Date of Visit: 09/27/2017   HPI:  Julie Boyd is a 42 y.o. female who presents with bilateral shin pain while running for approximately 6 months. The pain is located on the medial aspect of the middle third of the tibia. She describes a cramping pain with occasional sharp shooting pains proximally or distally. The pain is episodic, lasting a few minutes. It sometimes occurs at the start of her runs and usually multiple times throughout. She denies significant pain when she is not running and says that there is only a mild soreness after her runs. She rates the pain as 5/10 in both shins. She has tried ice, which provides moderate relief. Her PCP prescribed meloxicam which has not provided relief. She denies significant alterations regarding her running surface or the distance that she is running. She averages 30 miles a week, running 3-5 days. She has not had any significant swelling or skin changes. She denies numbness or tingling distally.   ROS: See HPI.  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 No hx LE surgeries  PHYSICAL EXAM: BP 100/60   Ht 5' (1.524 m)   Wt 148 lb (67.1 kg)   BMI 28.90 kg/m  Gen: well-appearing, NAD Left lower limb: No deformity, swelling, or bruising. Tenderness over the medial aspect of the tibia throughout the middle third. Point tenderness over the mid-shaft of anterior tibia at the muscle insertion. No tenderness at the lateral tibia, calf, achilles, or bony prominences of ankle. Ankles  full ROM.  5/5 strength throughout. NVI distally. Right lower limb: No deformity, swelling, or bruising. Tenderness over the medial aspect of the tibia throughout the middle third at the muscle insertion. No tenderness at the lateral tibia, calf, achilles, or bony prominences of ankle.  Bedside US performed: No signs of tibial stress fracture bilaterally.   ASSESSMENT/PLAN:  Shin splints  [place] was reassuring with no evidence of fracture. Recommended that  patient apply ice to both shins for about 15 minutes after running. Also provided exercises and stretches to be performed daily. She may continue to run as tolerated. Will follow-up in 3-4 weeks to evaluate for improvement in her pain.    FOLLOW UP: Follow up in 3-4 weeks for bilateral shin pain.   Dario Ave, Medical Student

## 2017-09-27 NOTE — Patient Instructions (Signed)
I think you have some symptoms of shin splints.  I want you to ice for 10 to 15 minutes on days that you run.  Ice both shins.  I want you to do some strengthening and stretching exercises every day.  Run as you normally would.  I think you will gradually improve.  You should not get worse and if you do give [place] a call.  Let me see you back in 3 to 4 weeks.  Great to see you again!

## 2017-09-30 NOTE — Progress Notes (Signed)
[facility] Attending Note: I have seen and examined this patient with the medical student. I have  reviewed the history, physical examination, assessment and plan as documented in the medical student's note.  I agree with the medical student's note and findings, assessment and treatment plan as documented with the following additions or changes: Note is adjusted. Do not think his is stress fracture.

## 2017-10-25 ENCOUNTER — Ambulatory Visit: Payer: BLUE CROSS/BLUE SHIELD | Admitting: Family Medicine

## 2017-11-04 DIAGNOSIS — N8110 Cystocele, unspecified: Secondary | ICD-10-CM | POA: Diagnosis not present

## 2017-11-04 DIAGNOSIS — Z01419 Encounter for gynecological examination (general) (routine) without abnormal findings: Secondary | ICD-10-CM | POA: Diagnosis not present

## 2017-11-04 DIAGNOSIS — R350 Frequency of micturition: Secondary | ICD-10-CM | POA: Diagnosis not present

## 2017-11-04 DIAGNOSIS — E559 Vitamin D deficiency, unspecified: Secondary | ICD-10-CM | POA: Diagnosis not present

## 2017-11-04 DIAGNOSIS — Z1231 Encounter for screening mammogram for malignant neoplasm of breast: Secondary | ICD-10-CM | POA: Diagnosis not present

## 2017-11-04 DIAGNOSIS — R5383 Other fatigue: Secondary | ICD-10-CM | POA: Diagnosis not present

## 2017-11-04 DIAGNOSIS — Z3040 Encounter for surveillance of contraceptives, unspecified: Secondary | ICD-10-CM | POA: Diagnosis not present

## 2017-12-05 DIAGNOSIS — N393 Stress incontinence (female) (male): Secondary | ICD-10-CM | POA: Diagnosis not present

## 2017-12-05 DIAGNOSIS — N819 Female genital prolapse, unspecified: Secondary | ICD-10-CM | POA: Diagnosis not present

## 2017-12-05 DIAGNOSIS — Z302 Encounter for sterilization: Secondary | ICD-10-CM | POA: Diagnosis not present

## 2017-12-19 DIAGNOSIS — R32 Unspecified urinary incontinence: Secondary | ICD-10-CM | POA: Diagnosis not present

## 2017-12-19 DIAGNOSIS — N8189 Other female genital prolapse: Secondary | ICD-10-CM | POA: Diagnosis not present

## 2017-12-19 DIAGNOSIS — Z0000 Encounter for general adult medical examination without abnormal findings: Secondary | ICD-10-CM | POA: Diagnosis not present

## 2017-12-30 DIAGNOSIS — N3281 Overactive bladder: Secondary | ICD-10-CM | POA: Diagnosis not present

## 2018-01-09 DIAGNOSIS — E559 Vitamin D deficiency, unspecified: Secondary | ICD-10-CM | POA: Diagnosis not present

## 2018-01-21 DIAGNOSIS — Z1231 Encounter for screening mammogram for malignant neoplasm of breast: Secondary | ICD-10-CM | POA: Diagnosis not present

## 2018-04-08 DIAGNOSIS — M9901 Segmental and somatic dysfunction of cervical region: Secondary | ICD-10-CM | POA: Diagnosis not present

## 2018-04-09 DIAGNOSIS — M9901 Segmental and somatic dysfunction of cervical region: Secondary | ICD-10-CM | POA: Diagnosis not present

## 2018-04-10 DIAGNOSIS — M9901 Segmental and somatic dysfunction of cervical region: Secondary | ICD-10-CM | POA: Diagnosis not present

## 2018-04-14 DIAGNOSIS — M9901 Segmental and somatic dysfunction of cervical region: Secondary | ICD-10-CM | POA: Diagnosis not present

## 2018-04-15 DIAGNOSIS — M9901 Segmental and somatic dysfunction of cervical region: Secondary | ICD-10-CM | POA: Diagnosis not present

## 2018-04-21 DIAGNOSIS — M9901 Segmental and somatic dysfunction of cervical region: Secondary | ICD-10-CM | POA: Diagnosis not present

## 2018-04-23 DIAGNOSIS — M9901 Segmental and somatic dysfunction of cervical region: Secondary | ICD-10-CM | POA: Diagnosis not present

## 2018-04-29 DIAGNOSIS — M9901 Segmental and somatic dysfunction of cervical region: Secondary | ICD-10-CM | POA: Diagnosis not present

## 2018-05-01 DIAGNOSIS — M9901 Segmental and somatic dysfunction of cervical region: Secondary | ICD-10-CM | POA: Diagnosis not present

## 2018-05-05 DIAGNOSIS — M9901 Segmental and somatic dysfunction of cervical region: Secondary | ICD-10-CM | POA: Diagnosis not present

## 2018-05-08 DIAGNOSIS — M9901 Segmental and somatic dysfunction of cervical region: Secondary | ICD-10-CM | POA: Diagnosis not present

## 2018-05-12 DIAGNOSIS — M9901 Segmental and somatic dysfunction of cervical region: Secondary | ICD-10-CM | POA: Diagnosis not present

## 2018-05-20 ENCOUNTER — Ambulatory Visit (INDEPENDENT_AMBULATORY_CARE_PROVIDER_SITE_OTHER): Payer: BLUE CROSS/BLUE SHIELD | Admitting: Family Medicine

## 2018-05-20 ENCOUNTER — Encounter: Payer: Self-pay | Admitting: Family Medicine

## 2018-05-20 ENCOUNTER — Other Ambulatory Visit: Payer: Self-pay

## 2018-05-20 VITALS — BP 96/54 | HR 54 | Temp 98.5°F | Resp 16 | Ht 61.02 in | Wt 151.4 lb

## 2018-05-20 DIAGNOSIS — Z0001 Encounter for general adult medical examination with abnormal findings: Secondary | ICD-10-CM

## 2018-05-20 DIAGNOSIS — Z1389 Encounter for screening for other disorder: Secondary | ICD-10-CM | POA: Diagnosis not present

## 2018-05-20 DIAGNOSIS — Z136 Encounter for screening for cardiovascular disorders: Secondary | ICD-10-CM

## 2018-05-20 DIAGNOSIS — Z1383 Encounter for screening for respiratory disorder NEC: Secondary | ICD-10-CM

## 2018-05-20 DIAGNOSIS — Z0000 Encounter for general adult medical examination without abnormal findings: Secondary | ICD-10-CM

## 2018-05-20 DIAGNOSIS — R350 Frequency of micturition: Secondary | ICD-10-CM

## 2018-05-20 DIAGNOSIS — D696 Thrombocytopenia, unspecified: Secondary | ICD-10-CM

## 2018-05-20 DIAGNOSIS — Z1329 Encounter for screening for other suspected endocrine disorder: Secondary | ICD-10-CM

## 2018-05-20 DIAGNOSIS — Z23 Encounter for immunization: Secondary | ICD-10-CM | POA: Diagnosis not present

## 2018-05-20 LAB — POCT URINALYSIS DIP (MANUAL ENTRY)
Bilirubin, UA: NEGATIVE
Blood, UA: NEGATIVE
Glucose, UA: NEGATIVE mg/dL
Ketones, POC UA: NEGATIVE mg/dL
Leukocytes, UA: NEGATIVE
NITRITE UA: NEGATIVE
Protein Ur, POC: NEGATIVE mg/dL
Spec Grav, UA: 1.02 (ref 1.010–1.025)
Urobilinogen, UA: 0.2 E.U./dL
pH, UA: 6.5 (ref 5.0–8.0)

## 2018-05-20 LAB — POCT CBC
Granulocyte percent: 66 %G (ref 37–80)
HCT, POC: 38.5 % (ref 29–41)
Hemoglobin: 13 g/dL (ref 11–14.6)
LYMPH, POC: 1.5 (ref 0.6–3.4)
MCH, POC: 29.7 pg (ref 27–31.2)
MCHC: 33.8 g/dL (ref 31.8–35.4)
MCV: 87.8 fL (ref 76–111)
MID (cbc): 0.1 (ref 0–0.9)
MPV: 6.9 fL (ref 0–99.8)
POC GRANULOCYTE: 3.2 (ref 2–6.9)
POC LYMPH PERCENT: 31.2 %L (ref 10–50)
POC MID %: 2.8 %M (ref 0–12)
Platelet Count, POC: 170 10*3/uL (ref 142–424)
RBC: 4.38 M/uL (ref 4.04–5.48)
RDW, POC: 13 %
WBC: 4.9 10*3/uL (ref 4.6–10.2)

## 2018-05-20 LAB — POC MICROSCOPIC URINALYSIS (UMFC): Mucus: ABSENT

## 2018-05-20 NOTE — Progress Notes (Signed)
Subjective:    Patient ID: Julie Boyd; female   DOB: 1975-06-16; 42 y.o.   MRN: [id number]  Chief Complaint  Patient presents with  . Annual Exam    HPI Primary Preventative Screenings: Cervical Cancer: Followed by Dr. Dois DavenportSandra Boyd; pap last 10/2016 Family Planning:Followed by Dr. Dois DavenportSandra Boyd STI screening:Followed by Dr. Dois DavenportSandra Boyd Breast Cancer:Followed by Dr. Dois DavenportSandra Boyd Colorectal Cancer: NA due to age Tobacco use/EtOH/substances: echo 05/15/17 seen by cards last yr Bone Density: Cardiac: Weight/Blood sugar/Diet/Exercise: BMI Readings from Last 3 Encounters:  05/20/18 28.58 kg/m  09/27/17 28.90 kg/m  07/04/17 28.09 kg/m   No results found for: HGBA1C OTC/Vit/Supp/Herbal: No longer taking Calcium or Vitamin D, stopped 3 years ago. Does not consume much milk. Reports spending a lot of time at the park. Dentist/Optho: Immunizations:  Immunization History  Administered Date(s) Administered  . Influenza Split 05/25/2013  . Influenza,inj,Quad PF,6+ Mos 05/05/2015, 03/15/2017  . Influenza-Unspecified 05/25/2013  . Tdap 09/17/2013     Chronic Medical Conditions: Has a prolapsed bladder - was started on a med by Dr. Estanislado Boyd but she hasn't started it as took a while to get it them and has already learned to deal with it. Does leak onself when running - during the day she does well but not a big deal - mainly at night she has URGE incontinence. Can't even count how many times she gets up.  She didn't have childcare for 704 yo son for all the PT appts so thought about surgery with bladder tck but Dr. Su Boyd but they wanted her to try the solifenacin 5mg  first.  Has chronic consitaption takes metamucil daily which does keep her stooling every day.   Was given valtrex for the "mouth sores" which is effective - HSV labialis.   Saw Julie Boyd for allergies.  Has been donating platetlets and notices that sometimes her level is lower - last was 180 - has been  doing it once a month and last episode was last Wed.   Medical History: Past Medical History:  Diagnosis Date  . Allergy   . Elderly multigravida 11/29/2013  . First trimester bleeding 11/29/2013  . Medical history non-contributory   . Vaginal delivery 11/29/2013   Past Surgical History:  Procedure Laterality Date  . Polyp removal     uterine polyp   Current Outpatient Medications on File Prior to Visit  Medication Sig Dispense Refill  . Multiple Vitamins-Minerals (MULTIVITAMIN ADULT EXTRA C PO) multivitamin    . valACYclovir (VALTREX) 1000 MG tablet valacyclovir 1 gram tablet  TAKE 1 TABLET BY MOUTH EVERY 12 HOURS FOR 5 DAYS     No current facility-administered medications on file prior to visit.    Allergies  Allergen Reactions  . Sulfa Antibiotics Rash  . Aleve [Naproxen] Rash    Pt says she can take Ibuprofen without a problem   Family History  Problem Relation Age of Onset  . Diabetes Father   . Cancer Maternal Grandfather   . Asthma Son   . Allergic rhinitis Neg Hx   . Angioedema Neg Hx   . Eczema Neg Hx   . Immunodeficiency Neg Hx   . Urticaria Neg Hx    Social History   Socioeconomic History  . Marital status: Significant Other    Spouse name: Annita BrodMarcelino Boyd  . Number of children: 3  . Years of education: 12+  . Highest education level: Not on file  Occupational History  . 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 Financial resource strain: Not on file  . Food insecurity:    Worry: Not on file    Inability: Not on file  . Transportation needs:    Medical: Not on file    Non-medical: Not on file  Tobacco Use  . Smoking status: Never Smoker  . Smokeless tobacco: Never Used  Substance and Sexual Activity  . Alcohol use: No    Alcohol/week: 0.0 standard drinks  . Drug use: No  . Sexual activity: Yes    Partners: Male    Birth control/protection: Coitus interruptus  Lifestyle  . Physical activity:    Days  per week: Not on file    Minutes per session: Not on file  . Stress: Not on file  Relationships  . Social connections:    Talks on phone: Not on file    Gets together: Not on file    Attends religious service: Not on file    Active member of club or organization: Not on file    Attends meetings of clubs or organizations: Not on file    Relationship status: Not on file  Other Topics Concern  . Not on file  Social History Narrative   Lives with her long-time boyfriend and their 3 sons.   Born in [place]. Came to the [place] in 1994, at age 28.   Depression screen [facility] 2/9 05/20/2018 07/04/2017 03/21/2017 05/05/2015  Decreased Interest 0 0 0 0  Down, Depressed, Hopeless 0 0 0 0  PHQ - 2 Score 0 0 0 0     ROS Otherwise as noted in HPI.  Objective:  BP (!) 96/54   Pulse (!) 54   Temp 98.5 F (36.9 C) (Oral)   Resp 16   Ht 5' 1.02" (1.55 m)   Wt 151 lb 6.4 oz (68.7 kg)   SpO2 98%   BMI 28.58 kg/m   Visual Acuity Screening   Right eye Left eye Both eyes  Without correction:     With correction: 20/15 20/15 20/15    Physical Exam Constitutional:      General: She is not in acute distress.    Appearance: She is well-developed. She is not diaphoretic.  HENT:     Head: Normocephalic and atraumatic.     Right Ear: Tympanic membrane, ear canal and external ear normal.     Left Ear: Tympanic membrane, ear canal and external ear normal.     Nose: Nose normal. No mucosal edema or rhinorrhea.     Mouth/Throat:     Pharynx: Uvula midline. No posterior oropharyngeal erythema.  Eyes:     General: No scleral icterus.       Right eye: No discharge.        Left eye: No discharge.     Conjunctiva/sclera: Conjunctivae normal.     Pupils: Pupils are equal, round, and reactive to light.  Neck:     Musculoskeletal: Normal range of motion and neck supple.     Thyroid: No thyromegaly.  Cardiovascular:     Rate and Rhythm: Normal rate and regular rhythm.     Heart sounds: Normal heart sounds.   Pulmonary:     Effort: Pulmonary effort is normal. No respiratory distress.     Breath sounds: Normal breath sounds.  Abdominal:     General: Bowel sounds are normal.     Palpations: Abdomen is soft.     Tenderness: There is no abdominal tenderness.  Lymphadenopathy:  Cervical: No cervical adenopathy.  Skin:    General: Skin is warm and dry.     Findings: No erythema.  Neurological:     Mental Status: She is alert and oriented to person, place, and time.     Deep Tendon Reflexes: Reflexes are normal and symmetric.  Psychiatric:        Behavior: Behavior normal.    POC TESTING Office Visit on 05/20/2018  Component Date Value Ref Range Status  . Color, UA 05/20/2018 yellow  yellow Final  . Clarity, UA 05/20/2018 clear  clear Final  . Glucose, UA 05/20/2018 negative  negative mg/dL Final  . Bilirubin, UA 05/20/2018 negative  negative Final  . Ketones, POC UA 05/20/2018 negative  negative mg/dL Final  . Spec Grav, UA 05/20/2018 1.020  1.010 - 1.025 Final  . Blood, UA 05/20/2018 negative  negative Final  . pH, UA 05/20/2018 6.5  5.0 - 8.0 Final  . Protein Ur, POC 05/20/2018 negative  negative mg/dL Final  . Urobilinogen, UA 05/20/2018 0.2  0.2 or 1.0 E.U./dL Final  . Nitrite, UA 40/98/1191 Negative  Negative Final  . Leukocytes, UA 05/20/2018 Negative  Negative Final  . WBC,UR,HPF,POC 05/20/2018 None  None WBC/hpf Final  . RBC,UR,HPF,POC 05/20/2018 None  None RBC/hpf Final  . Bacteria 05/20/2018 Few* None, Too numerous to count Final  . Mucus 05/20/2018 Absent  Absent Final  . Epithelial Cells, UR Per Microscopy 05/20/2018 Moderate* None, Too numerous to count cells/hpf Final  . WBC 05/20/2018 4.9  4.6 - 10.2 K/uL Final  . Lymph, poc 05/20/2018 1.5  0.6 - 3.4 Final  . POC LYMPH PERCENT 05/20/2018 31.2  10 - 50 %L Final  . MID (cbc) 05/20/2018 0.1  0 - 0.9 Final  . POC MID % 05/20/2018 2.8  0 - 12 %M Final  . POC Granulocyte 05/20/2018 3.2  2 - 6.9 Final  . Granulocyte  percent 05/20/2018 66.0  37 - 80 %G Final  . RBC 05/20/2018 4.38  4.04 - 5.48 M/uL Final  . Hemoglobin 05/20/2018 13.0  11 - 14.6 g/dL Final  . HCT, POC 47/82/9562 38.5  29 - 41 % Final  . MCV 05/20/2018 87.8  76 - 111 fL Final  . MCH, POC 05/20/2018 29.7  27 - 31.2 pg Final  . MCHC 05/20/2018 33.8  31.8 - 35.4 g/dL Final  . RDW, POC 13/12/6576 13.0  % Final  . Platelet Count, POC 05/20/2018 170  142 - 424 K/uL Final  . MPV 05/20/2018 6.9  0 - 99.8 fL Final     Assessment & Plan:   1. Annual physical exam   2. Screening for cardiovascular, respiratory, and genitourinary diseases   3. Screening for thyroid disorder   4. Thrombocytopenia (HCC)   5. Urinary frequency    Call if likes bladder med at night and looses ins - I wil lcall her in a years supply of qhs vesicar or ditropan she can buy otc  Patient will continue on current chronic medications other than changes noted above, so ok to refill when needed.   Reviewed all health maintenance recommendations per USPSTF guidelines.   See after visit summary for patient specific instructions.  Orders Placed This Encounter  Procedures  . Flu Vaccine QUAD 36+ mos IM  . Comprehensive metabolic panel    Order Specific Question:   Has the patient fasted?    Answer:   No  . TSH  . Pathologist smear review  . POCT urinalysis dipstick  .  POCT Microscopic Urinalysis ([facility])  . POCT CBC      Patient verbalized to me that they understand the following: diagnosis, what is being done for them, what to expect and what should be done at home.  Their questions have been answered. They understand that I am unable to predict every possible medication interaction or adverse outcome and that if any unexpected symptoms arise, they should contact us and their pharmacist, as well as never hesitate to seek urgent/emergent care at [facility] or ER if they think it might be warranted.    Norberto SorensonEva Shaneese Tait, MD, MPH Primary Care at [facility]  [facility] [address] [place], [place]  [id number] [phone] Office phone  [phone] Office fax   05/20/18 9:58 AM

## 2018-05-20 NOTE — Patient Instructions (Addendum)
If you have lab work done today you will be contacted with your lab results within the next 2 weeks.  If you have not heard from us then please contact us. The fastest way to get your results is to register for My Chart.   IF you received an x-ray today, you will receive an invoice from [facility]. Please contact [facility] at [phone] with questions or concerns regarding your invoice.   IF you received labwork today, you will receive an invoice from [facility]. Please contact [facility] at [phone] with questions or concerns regarding your invoice.   Our billing staff will not be able to assist you with questions regarding bills from these companies.  You will be contacted with the lab results as soon as they are available. The fastest way to get your results is to activate your My Chart account. Instructions are located on the last page of this paperwork. If you have not heard from us regarding the results in 2 weeks, please contact this office.     Thrombocytopenia  Thrombocytopenia is a condition in which you have an abnormally decreased number of platelets in your blood. Platelets are also called thrombocytes. Platelets are needed for blood clotting. Some cases of thrombocytopenia are mild while others are more severe. What are the causes? This condition may be caused by:  Decreased production of platelets. This can be caused by: ? Aplastic anemia, in which your bone marrow quits making blood cells. ? Cancer in the bone marrow. ? Use of certain medicines, including chemotherapy. ? Infection in the bone marrow. ? Heavy alcohol consumption.  Increased destruction of platelets. This can be caused by: ? Certain immune diseases. ? Use of certain drugs. ? Certain blood clotting disorders. ? Certain inherited disorders. ? Certain bleeding disorders. ? Pregnancy.  Having an enlarged spleen (hypersplenism). In hypersplenism, the spleen gathers up  platelets from circulation. This means that the platelets are not available to help with blood clotting. The spleen can be enlarged because of cirrhosis or other conditions. What are the signs or symptoms? Symptoms of this condition are side effects of poor blood clotting. They will vary depending on how low the platelet counts are. Symptoms may include:  Abnormal bleeding.  Nosebleeds.  Heavy menstrual periods.  Blood in the urine or stool (feces).  A purplish discoloration in the skin (purpura).  Bruising.  A rash that looks like pinpoint, purplish-red spots (petechiae) on the skin and mucous membranes. How is this diagnosed? This condition may be diagnosed with blood tests and a physical exam. Sometimes, a sample of bone marrow may be removed to look for the original cells (megakaryocytes) that make platelets. How is this treated? Treatment for this condition depends on the cause. Treatment options may include:  Treatment of another condition that is causing the low platelet count.  Medicines to help protect your platelets from being destroyed.  A replacement (transfusion) of platelets to stop or prevent bleeding.  Surgery to remove the spleen. Follow these instructions at home: General instructions  Check your skin and the linings inside your mouth for bruising or bleeding as told by your health care provider.  Check your sputum, urine, and stool for blood as told by your health care provider.  Ask your health care provider if it is okay for you to drink alcohol.  Take over-the-counter and prescription medicines only as told by your health care provider.  Tell all of your health care providers, including dentists and  eye doctors, about your condition. Activity  Until your health care provider says it is okay. ? Do not return to any activities that could cause bumps or bruises.  Take extra care not to cut yourself when you shave or when you use scissors, needles,  knives, and other tools.  Take extra care not to burn yourself when ironing or cooking. Contact a health care provider if:  You have unexplained bruising. Get help right away if:  You have active bleeding from anywhere on your body.  You have blood in your sputum, urine, or stool. This information is not intended to replace advice given to you by your health care provider. Make sure you discuss any questions you have with your health care provider. Document Released: 05/07/2005 Document Revised: 01/08/2016 Document Reviewed: 11/08/2014 Elsevier Interactive Patient Education  2019 [employer]

## 2018-05-21 LAB — COMPREHENSIVE METABOLIC PANEL
ALBUMIN: 4.2 g/dL (ref 3.5–5.5)
ALK PHOS: 50 IU/L (ref 39–117)
ALT: 9 IU/L (ref 0–32)
AST: 13 IU/L (ref 0–40)
Albumin/Globulin Ratio: 1.9 (ref 1.2–2.2)
BUN/Creatinine Ratio: 12 (ref 9–23)
BUN: 7 mg/dL (ref 6–24)
Bilirubin Total: 0.5 mg/dL (ref 0.0–1.2)
CO2: 22 mmol/L (ref 20–29)
Calcium: 9 mg/dL (ref 8.7–10.2)
Chloride: 103 mmol/L (ref 96–106)
Creatinine, Ser: 0.59 mg/dL (ref 0.57–1.00)
GFR calc Af Amer: 133 mL/min/{1.73_m2} (ref >59)
GFR calc non Af Amer: 115 mL/min/{1.73_m2} (ref >59)
Globulin, Total: 2.2 g/dL (ref 1.5–4.5)
Glucose: 80 mg/dL (ref 65–99)
Potassium: 4.2 mmol/L (ref 3.5–5.2)
Sodium: 138 mmol/L (ref 134–144)
TOTAL PROTEIN: 6.4 g/dL (ref 6.0–8.5)

## 2018-05-21 LAB — TSH: TSH: 2.63 u[IU]/mL (ref 0.450–4.500)

## 2018-05-23 LAB — PATHOLOGIST SMEAR REVIEW
Basophils Absolute: 0.1 10*3/uL (ref 0.0–0.2)
Basos: 1 %
EOS (ABSOLUTE): 0.1 10*3/uL (ref 0.0–0.4)
Eos: 2 %
Hematocrit: 37.1 % (ref 34.0–46.6)
Hemoglobin: 12.9 g/dL (ref 11.1–15.9)
Immature Grans (Abs): 0 10*3/uL (ref 0.0–0.1)
Immature Granulocytes: 0 %
Lymphocytes Absolute: 1.3 10*3/uL (ref 0.7–3.1)
Lymphs: 28 %
MCH: 30.1 pg (ref 26.6–33.0)
MCHC: 34.8 g/dL (ref 31.5–35.7)
MCV: 87 fL (ref 79–97)
Monocytes Absolute: 0.3 10*3/uL (ref 0.1–0.9)
Monocytes: 7 %
NEUTROS PCT: 62 %
Neutrophils Absolute: 2.9 10*3/uL (ref 1.4–7.0)
Path Rev PLTs: NORMAL
Path Rev RBC: NORMAL
Path Rev WBC: NORMAL
Platelets: 171 10*3/uL (ref 150–450)
RBC: 4.29 x10E6/uL (ref 3.77–5.28)
RDW: 12.7 % (ref 12.3–15.4)
WBC: 4.8 10*3/uL (ref 3.4–10.8)

## 2018-06-09 ENCOUNTER — Telehealth: Payer: Self-pay | Admitting: *Deleted

## 2018-06-09 NOTE — Telephone Encounter (Signed)
Patient has reviewed her recent lab results and she has questions about her platelet count. Patient is aware that her results are normal- but she states she always seem to be borderline low- 170/180 range.(Can she ever get above 200 and stay there on her own.) She states she has a hard time getting above that level. She wants to know if she does not donate every month will that number go up- or does that matter. She also wants to know if she is too low what symptoms other tham fatigue should she look for. She donates platelets monthly and wants to continue- but not if it is bad for her.

## 2018-06-10 NOTE — Telephone Encounter (Signed)
Please advise 

## 2019-10-11 IMAGING — DX DG TIBIA/FIBULA 2V*R*
3 series · 3 of 3 positions shown · non-contrast
Comparison: None.

CLINICAL DATA: Pain in shin.

EXAM:
RIGHT TIBIA AND FIBULA - 2 VIEW

[tibia ap (1 of 2)]
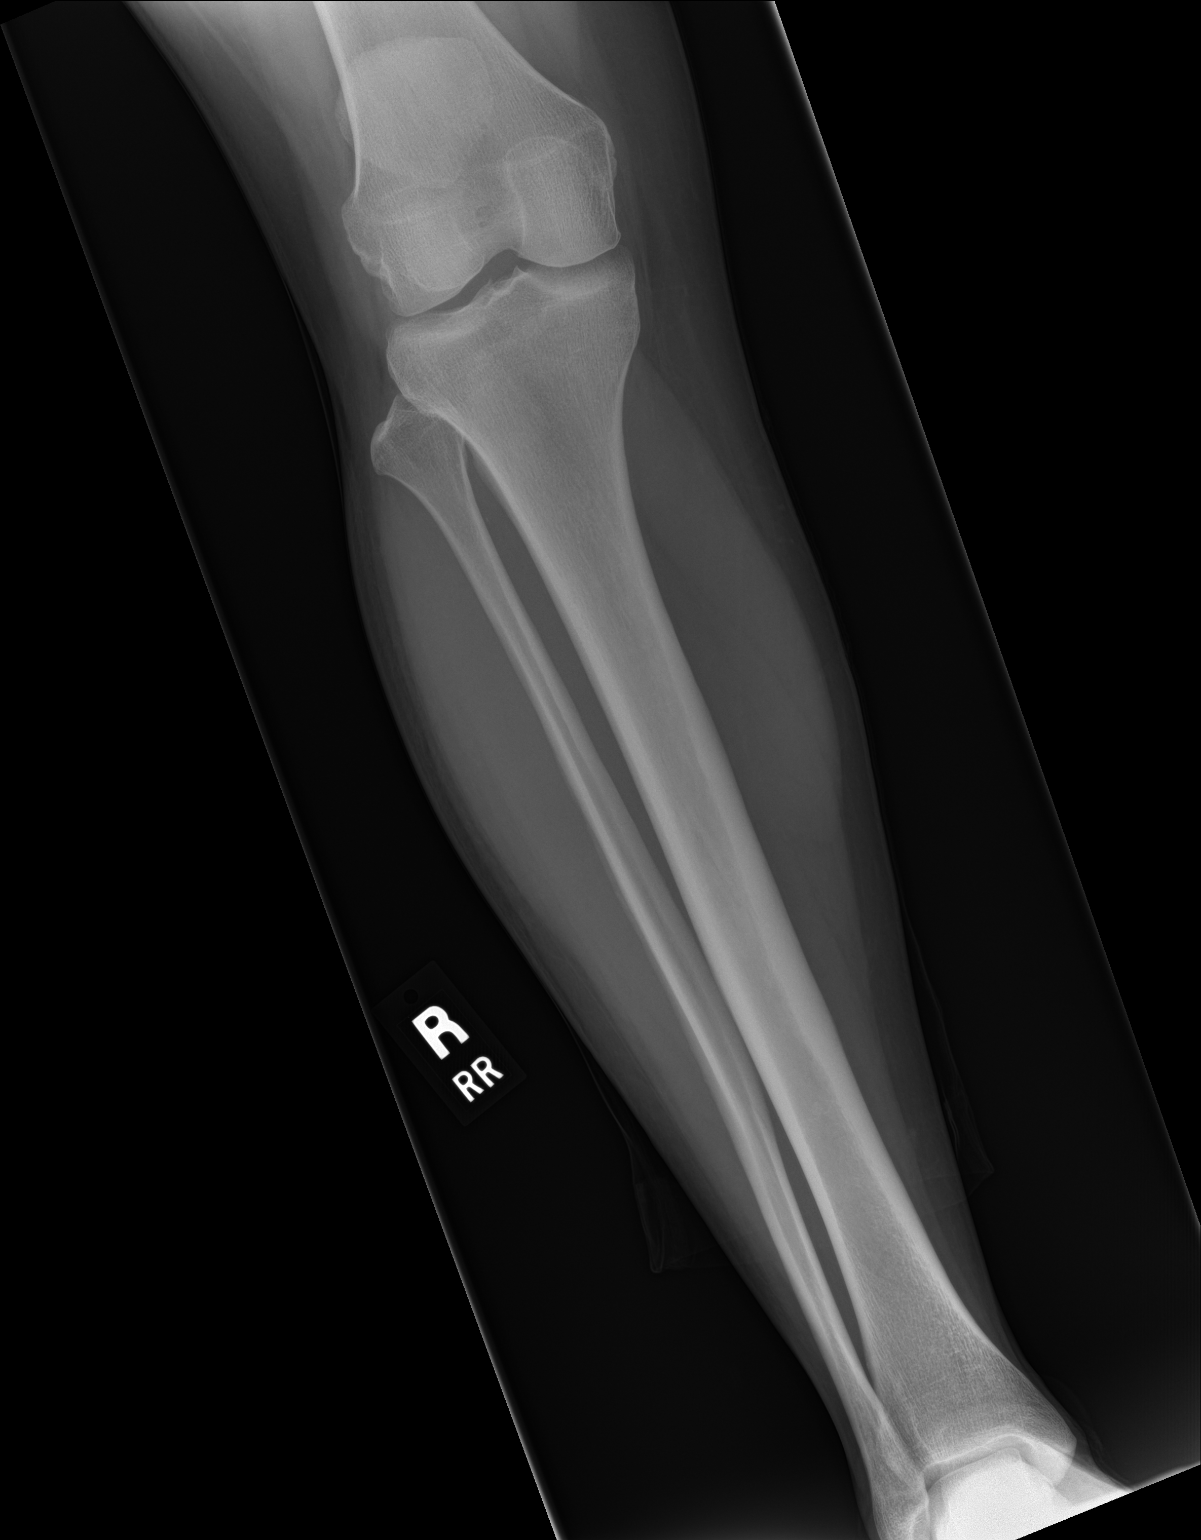

[tibia ap (2 of 2)]
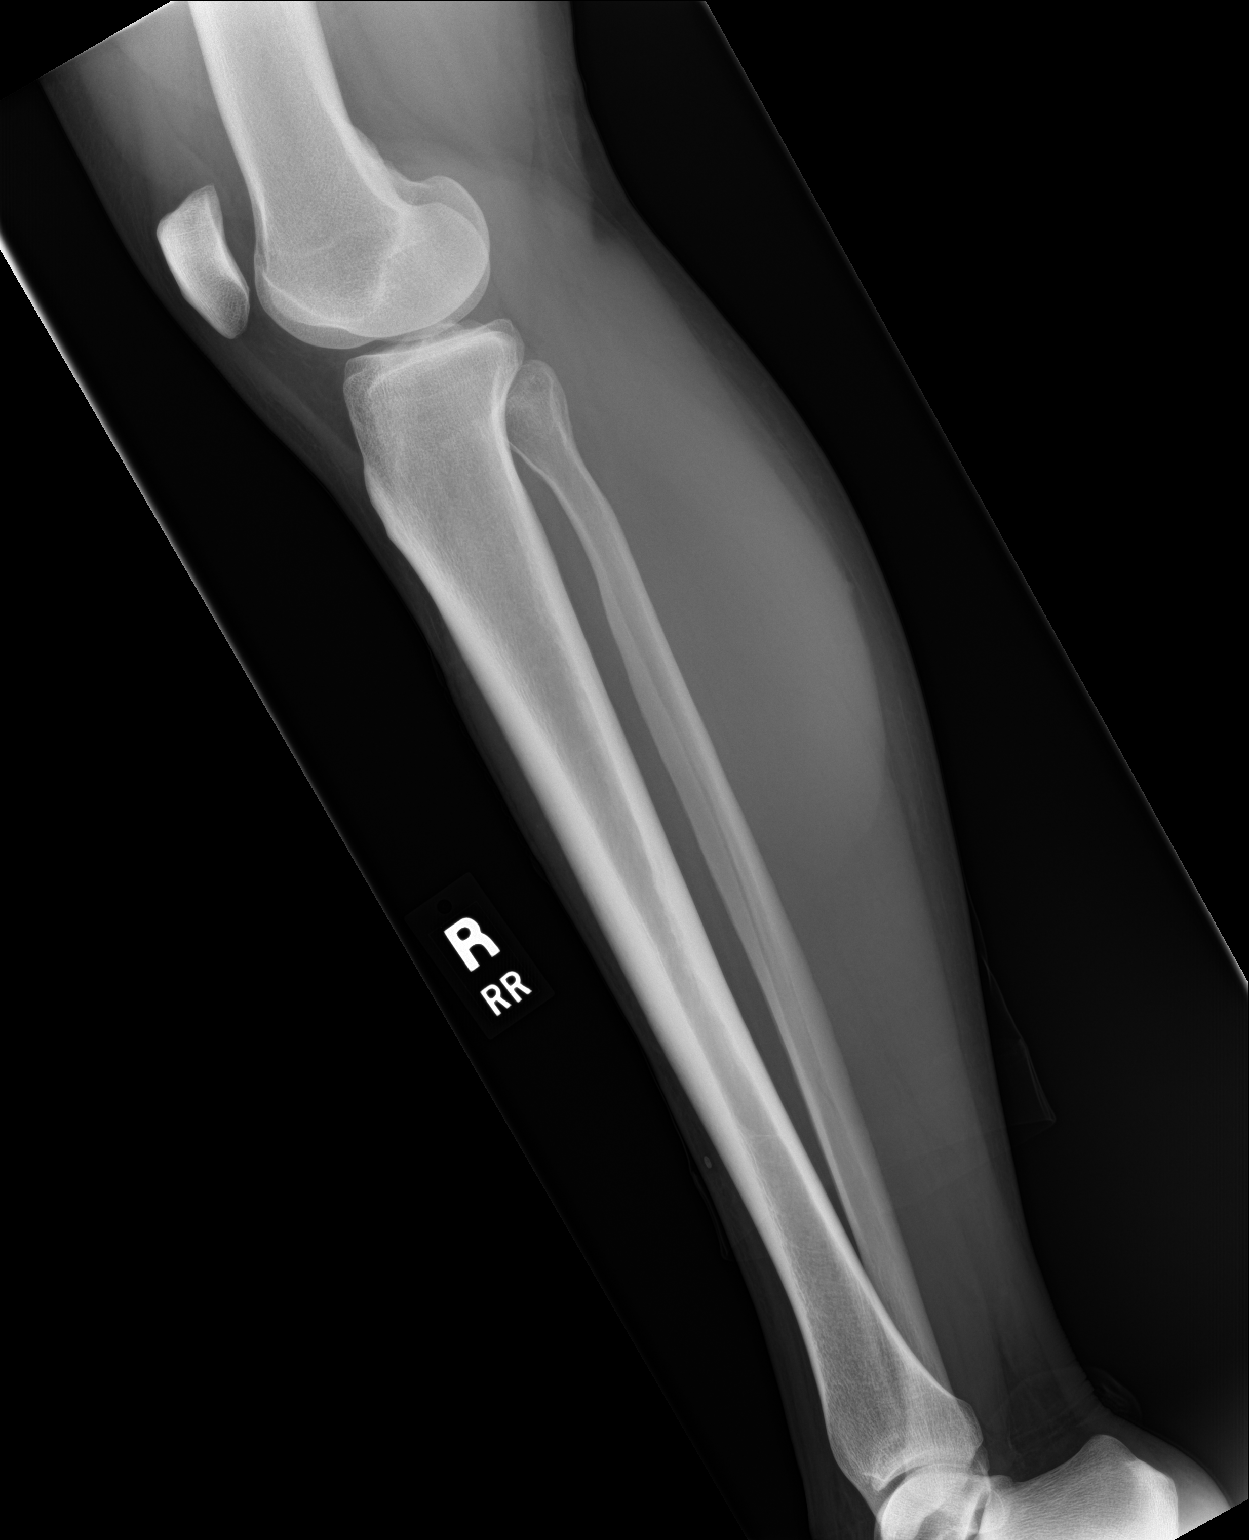

[tibia lat]
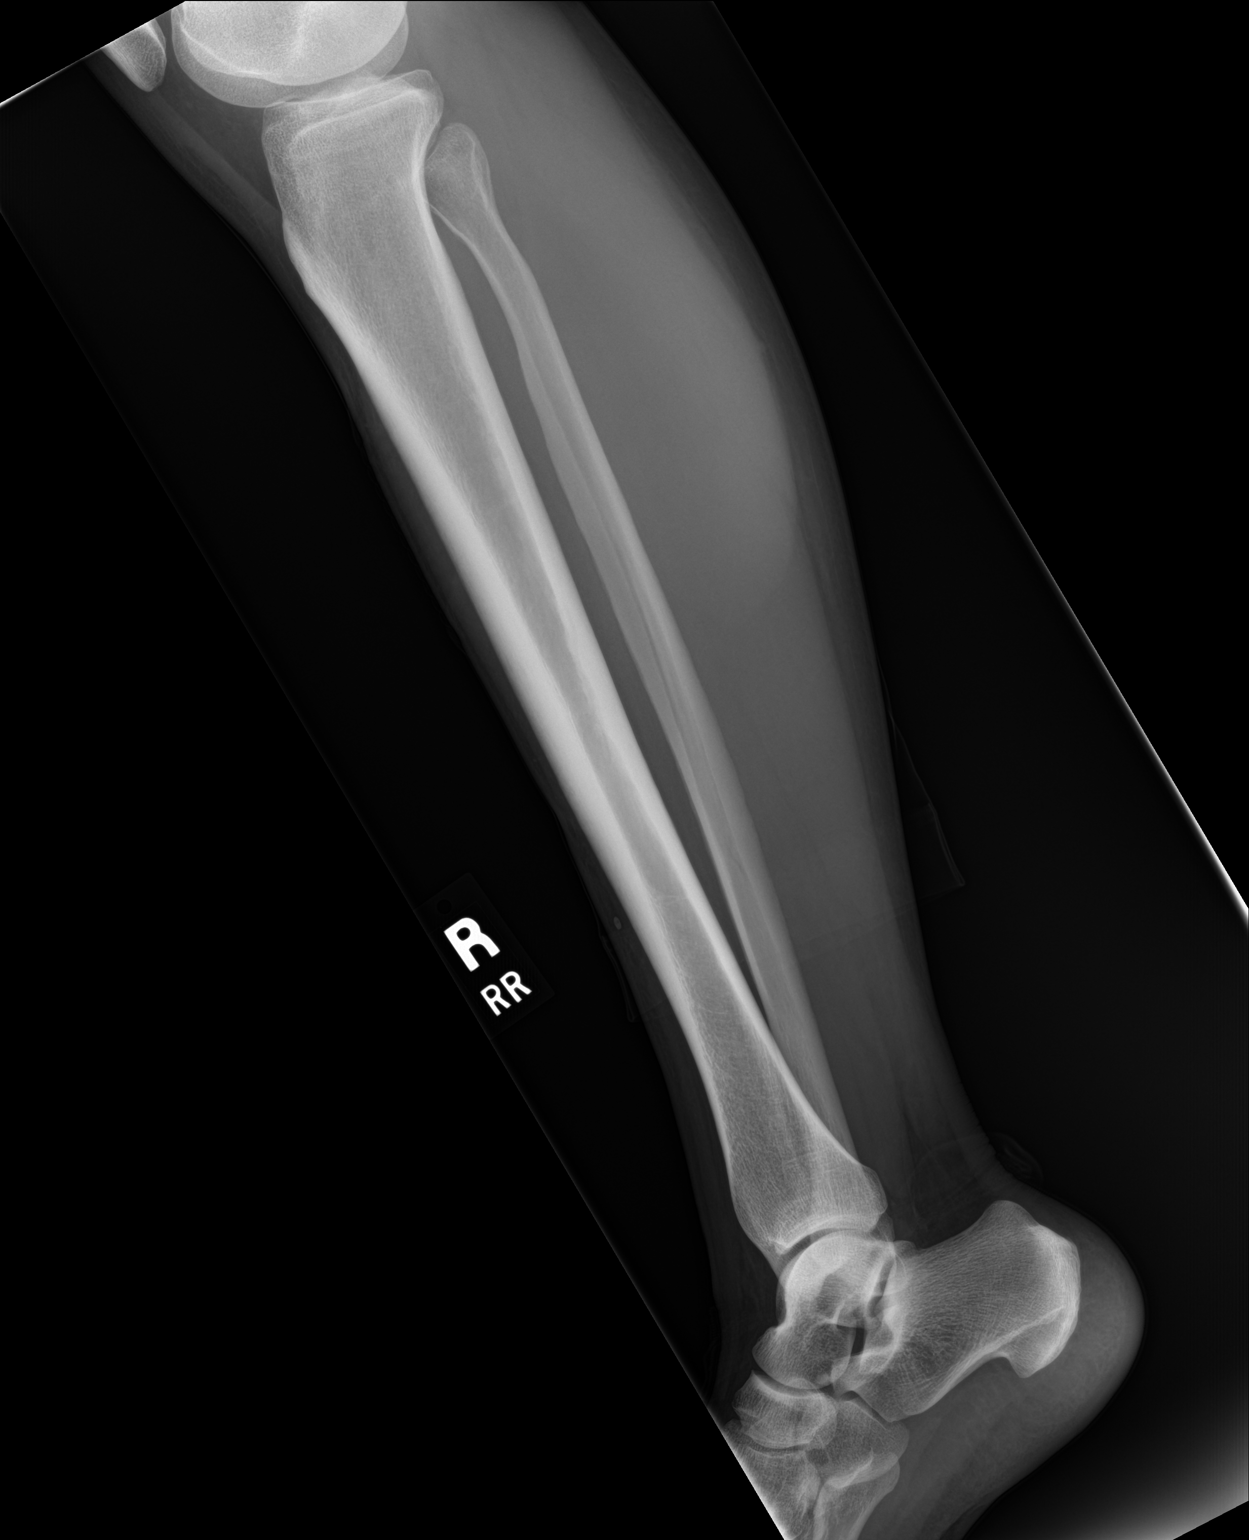

[3 of 3 positions shown; findings below may reference images not displayed]

FINDINGS: There is no evidence of fracture or other focal bone lesions. Soft
tissues are unremarkable.
IMPRESSION: Negative.

## 2021-07-11 ENCOUNTER — Encounter: Payer: Self-pay | Admitting: Emergency Medicine

## 2021-07-11 ENCOUNTER — Other Ambulatory Visit: Payer: Self-pay

## 2021-07-11 ENCOUNTER — Ambulatory Visit (INDEPENDENT_AMBULATORY_CARE_PROVIDER_SITE_OTHER): Payer: BLUE CROSS/BLUE SHIELD | Admitting: Emergency Medicine

## 2021-07-11 VITALS — BP 122/62 | HR 52 | Temp 98.4°F | Ht 60.0 in | Wt 173.0 lb

## 2021-07-11 DIAGNOSIS — Z13228 Encounter for screening for other metabolic disorders: Secondary | ICD-10-CM

## 2021-07-11 DIAGNOSIS — Z13220 Encounter for screening for lipoid disorders: Secondary | ICD-10-CM

## 2021-07-11 DIAGNOSIS — Z1329 Encounter for screening for other suspected endocrine disorder: Secondary | ICD-10-CM

## 2021-07-11 DIAGNOSIS — Z130 Encounter for screening for diseases of the blood and blood-forming organs and certain disorders involving the immune mechanism: Secondary | ICD-10-CM

## 2021-07-11 DIAGNOSIS — Z0000 Encounter for general adult medical examination without abnormal findings: Secondary | ICD-10-CM

## 2021-07-11 DIAGNOSIS — Z1159 Encounter for screening for other viral diseases: Secondary | ICD-10-CM

## 2021-07-11 LAB — LIPID PANEL
Cholesterol: 180 mg/dL (ref 0–200)
HDL: 64.7 mg/dL (ref >39.00)
LDL Cholesterol: 92 mg/dL (ref 0–99)
NonHDL: 115.58
Total CHOL/HDL Ratio: 3
Triglycerides: 120 mg/dL (ref 0.0–149.0)
VLDL: 24 mg/dL (ref 0.0–40.0)

## 2021-07-11 LAB — CBC WITH DIFFERENTIAL/PLATELET
Basophils Absolute: 0.1 10*3/uL (ref 0.0–0.1)
Basophils Relative: 1.2 % (ref 0.0–3.0)
Eosinophils Absolute: 0.2 10*3/uL (ref 0.0–0.7)
Eosinophils Relative: 2.8 % (ref 0.0–5.0)
HCT: 39.7 % (ref 36.0–46.0)
Hemoglobin: 13.5 g/dL (ref 12.0–15.0)
Lymphocytes Relative: 22.9 % (ref 12.0–46.0)
Lymphs Abs: 1.3 10*3/uL (ref 0.7–4.0)
MCHC: 34.1 g/dL (ref 30.0–36.0)
MCV: 85.8 fl (ref 78.0–100.0)
Monocytes Absolute: 0.3 10*3/uL (ref 0.1–1.0)
Monocytes Relative: 5.8 % (ref 3.0–12.0)
Neutro Abs: 3.8 10*3/uL (ref 1.4–7.7)
Neutrophils Relative %: 67.3 % (ref 43.0–77.0)
Platelets: 164 10*3/uL (ref 150.0–400.0)
RBC: 4.62 Mil/uL (ref 3.87–5.11)
RDW: 13.2 % (ref 11.5–15.5)
WBC: 5.6 10*3/uL (ref 4.0–10.5)

## 2021-07-11 LAB — COMPREHENSIVE METABOLIC PANEL
ALT: 10 U/L (ref 0–35)
AST: 11 U/L (ref 0–37)
Albumin: 4.4 g/dL (ref 3.5–5.2)
Alkaline Phosphatase: 46 U/L (ref 39–117)
BUN: 11 mg/dL (ref 6–23)
CO2: 28 mEq/L (ref 19–32)
Calcium: 9 mg/dL (ref 8.4–10.5)
Chloride: 105 mEq/L (ref 96–112)
Creatinine, Ser: 0.71 mg/dL (ref 0.40–1.20)
GFR: 104.1 mL/min (ref >60.00)
Glucose, Bld: 90 mg/dL (ref 70–99)
Potassium: 4 mEq/L (ref 3.5–5.1)
Sodium: 138 mEq/L (ref 135–145)
Total Bilirubin: 0.4 mg/dL (ref 0.2–1.2)
Total Protein: 7.1 g/dL (ref 6.0–8.3)

## 2021-07-11 LAB — TSH: TSH: 3.52 u[IU]/mL (ref 0.35–5.50)

## 2021-07-11 LAB — HEMOGLOBIN A1C: Hgb A1c MFr Bld: 5.2 % (ref 4.6–6.5)

## 2021-07-11 NOTE — Progress Notes (Signed)
New Patient Office Visit  Subjective:  Patient ID: Julie Boyd, female    DOB: [birth year]  Age: 46 y.o. MRN: [id number]  CC:  Chief Complaint  Patient presents with   New Patient (Initial Visit)    physical    HPI Julie Boyd here for physical. First visit to this office.  Wants establish care with me. Healthy female with a healthy lifestyle. No complaints or medical concerns today. Sees gynecologist on a regular basis. Had normal mammogram done in 2021.  Past Medical History:  Diagnosis Date   Allergy    Elderly multigravida 11/29/2013   First trimester bleeding 11/29/2013   Medical history non-contributory    Vaginal delivery 11/29/2013    Past Surgical History:  Procedure Laterality Date   Polyp removal     uterine polyp    Family History  Problem Relation Age of Onset   Diabetes Father    Cancer Maternal Grandfather    Asthma Son    Allergic rhinitis Neg Hx    Angioedema Neg Hx    Eczema Neg Hx    Immunodeficiency Neg Hx    Urticaria Neg Hx     Social History   Socioeconomic History   Marital status: Significant Other    Spouse name: [name]   Number of children: 3   Years of education: 12+   Highest education level: Not on file  Occupational History   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   Born [place] Came to the [place] in [place], at age 46.   Social Determinants of Health   Financial Resource Strain: Not on file  Food Insecurity: Not on file  Transportation Needs: Not on file  Physical Activity: Not on file  Stress:  Not on file  Social Connections: Not on file  Intimate Partner Violence: Not on file    ROS Review of Systems  Constitutional: Negative.  Negative for fatigue and fever.  HENT: Negative.  Negative for congestion and sore throat.   Eyes: Negative.   Respiratory: Negative.  Negative for cough and shortness of breath.   Cardiovascular: Negative.  Negative for chest pain and leg swelling.  Gastrointestinal: Negative.  Negative for abdominal pain, diarrhea and nausea.  Genitourinary: Negative.   Musculoskeletal:  Negative for back pain.  Neurological: Negative.  Negative for dizziness.  All other systems reviewed and are negative.  Objective:   Today's Vitals: There were no vitals taken for this visit.  Physical Exam Vitals reviewed.  Constitutional:      Appearance: Normal appearance.  HENT:     Head: Normocephalic.     Right Ear: Tympanic membrane, ear canal and external ear normal.     Left Ear: Tympanic membrane, ear canal and external ear normal.     Mouth/Throat:     Mouth: Mucous membranes are moist.     Pharynx: Oropharynx is clear.  Eyes:     Extraocular Movements: Extraocular movements intact.     Conjunctiva/sclera: Conjunctivae normal.     Pupils: Pupils are equal, round,  and reactive to light.  Cardiovascular:     Rate and Rhythm: Normal rate and regular rhythm.     Pulses: Normal pulses.     Heart sounds: Normal heart sounds.  Pulmonary:     Effort: Pulmonary effort is normal.     Breath sounds: Normal breath sounds.  Abdominal:     Palpations: Abdomen is soft.     Tenderness: There is no abdominal tenderness.  Musculoskeletal:        General: Normal range of motion.     Cervical back: Normal range of motion and neck supple. No tenderness.     Right lower leg: No edema.     Left lower leg: No edema.  Lymphadenopathy:     Cervical: No cervical adenopathy.  Skin:    General: Skin is warm and dry.     Capillary Refill: Capillary refill takes less than 2  seconds.  Neurological:     General: No focal deficit present.     Mental Status: She is alert and oriented to person, place, and time.  Psychiatric:        Mood and Affect: Mood normal.        Behavior: Behavior normal.    Assessment & Plan:   Problem List Items Addressed This Visit   None  Problem List Items Addressed This Visit   None Visit Diagnoses     Routine general medical examination at a health care facility    -  Primary   Need for hepatitis C screening test       Relevant Orders   Hepatitis C antibody screen   Screening for deficiency anemia       Relevant Orders   CBC with Differential   Screening for lipoid disorders       Relevant Orders   Lipid panel   Screening for endocrine, metabolic and immunity disorder       Relevant Orders   Comprehensive metabolic panel   Hemoglobin A1c   TSH     Modifiable risk factors discussed with patient. Anticipatory guidance according to age provided. The following topics were also discussed: Social Determinants of Health Smoking.  Non-smoker Diet and nutrition Benefits of exercise Cancer family history reviewed Vaccinations recommendations Cardiovascular risk assessment.  Blood work done today Mental health including depression and anxiety Fall and accident prevention    Outpatient Encounter Medications as of 07/11/2021  Medication Sig   Multiple Vitamins-Minerals (MULTIVITAMIN ADULT EXTRA C PO) multivitamin (Patient not taking: Reported on 07/11/2021)   valACYclovir (VALTREX) 1000 MG tablet valacyclovir 1 gram tablet  TAKE 1 TABLET BY MOUTH EVERY 12 HOURS FOR 5 DAYS (Patient not taking: Reported on 07/11/2021)   No facility-administered encounter medications on file as of 07/11/2021.   Patient Instructions  Mantenimiento de Radiographer, therapeutic en las mujeres Health Maintenance, Female Adoptar un estilo de vida saludable y recibir atencin preventiva son importantes para promover la salud y Counsellor. Consulte al mdico  sobre: El esquema adecuado para hacerse pruebas y exmenes peridicos. Cosas que puede hacer por su cuenta para prevenir enfermedades y [place] sano. Qu debo saber sobre la dieta, el peso y el ejercicio? Consuma una dieta saludable  Consuma una dieta que incluya muchas verduras, frutas, productos lcteos con bajo contenido de [place] y Associate Professor. No consuma muchos alimentos ricos en grasas slidas, azcares agregados o sodio. Mantenga un peso saludable El ndice de masa muscular [facility]) se [place] para identificar problemas de [place]. Proporciona una estimacin de la grasa  corporal basndose en el peso y la altura. Su mdico puede ayudarle a Engineer, site IMC y a Personnel officer o Pharmacologist un peso saludable. Haga ejercicio con regularidad Haga ejercicio con regularidad. Esta es una de las prcticas ms importantes que puede hacer por su salud. La [employer] de los adultos deben seguir estas pautas: Education officer, environmental, al menos, 150 minutos de actividad fsica por semana. El ejercicio debe aumentar la frecuencia cardaca y Media planner transpirar (ejercicio de intensidad moderada). Hacer ejercicios de fortalecimiento por lo [employer] por semana. Agregue esto a su plan de ejercicio de intensidad moderada. Pase menos tiempo sentada. Incluso la actividad fsica ligera puede ser beneficiosa. Controle sus niveles de colesterol y lpidos en la sangre Comience a realizarse anlisis de lpidos y Oncologist en la sangre a los 20 aos y luego reptalos cada 5 aos. Hgase controlar los niveles de colesterol con mayor frecuencia si: Sus niveles de lpidos y colesterol son altos. Es mayor de 40 aos. Presenta un alto riesgo de padecer enfermedades cardacas. Qu debo saber sobre las pruebas de deteccin del cncer? Segn su historia clnica y sus antecedentes familiares, es posible que deba realizarse pruebas de deteccin del cncer en diferentes edades. Esto puede incluir pruebas de deteccin de lo siguiente: Cncer de  mama. Cncer de cuello uterino. Cncer colorrectal. Cncer de piel. Cncer de pulmn. Qu debo saber sobre la enfermedad cardaca, la diabetes y la hipertensin arterial? Presin arterial y enfermedad cardaca La hipertensin arterial causa enfermedades cardacas y [place] el riesgo de accidente cerebrovascular. Es ms probable que esto se manifieste en las personas que tienen lecturas de presin arterial alta o tienen sobrepeso. Hgase controlar la presin arterial: Cada 3 a 5 aos si tiene entre 18 y 65 aos. Todos los aos si es mayor de 40 aos. Diabetes Realcese exmenes de deteccin de la diabetes con regularidad. Este anlisis revisa el nivel de azcar en la sangre en [place]. Hgase las pruebas de deteccin: Cada tres aos despus de los 40 aos de edad si tiene un peso normal y un bajo riesgo de padecer diabetes. Con ms frecuencia y a partir de [place] edad inferior si tiene sobrepeso o un alto riesgo de padecer diabetes. Qu debo saber sobre la prevencin de infecciones? Hepatitis B Si tiene un riesgo ms alto de contraer hepatitis B, debe someterse a un examen de deteccin de este virus. Hable con el mdico para averiguar si tiene riesgo de contraer la infeccin por hepatitis B. Hepatitis C Se recomienda el anlisis a: [employer] 1945 y 1965. Todas las personas que tengan un riesgo de haber contrado hepatitis C. Enfermedades de transmisin sexual (ETS) Hgase las pruebas de Airline pilot de ITS, incluidas la gonorrea y la clamidia, si: Es sexualmente activa y es menor de [address]. Es mayor de [address], y Public affairs consultant informa que corre riesgo de tener este tipo de infecciones. La actividad sexual ha cambiado desde que le hicieron la ltima prueba de deteccin y tiene un riesgo mayor de Warehouse manager clamidia o Copy. Pregntele al mdico si usted tiene riesgo. Pregntele al mdico si usted tiene un alto riesgo de Primary school teacher VIH. El mdico tambin puede recomendarle un  medicamento recetado para ayudar a evitar la infeccin por el VIH. Si elige tomar medicamentos para prevenir el VIH, primero debe [employer] de deteccin del VIH. Luego debe hacerse anlisis cada 3 meses mientras est tomando los medicamentos. Embarazo Si est por dejar de Armed forces training and education officer (fase premenopusica) y usted puede quedar [place],  busque asesoramiento antes de quedar embarazada. Tome de 400 a 800 microgramos (mcg) de cido [employer] si [place]. Pida mtodos de control de la natalidad (anticonceptivos) si desea evitar un embarazo no deseado. Osteoporosis y [place] La osteoporosis es una enfermedad en la que los huesos pierden los minerales y la fuerza por el avance de la edad. El resultado pueden ser fracturas en los [place]. Si tiene 65 aos o ms, o si est en riesgo de sufrir osteoporosis y fracturas, pregunte a su mdico si debe: Hacerse pruebas de deteccin de prdida sea. Tomar un suplemento de calcio o de vitamina D para reducir el riesgo de fracturas. Recibir terapia de reemplazo hormonal (TRH) para tratar los sntomas de la menopausia. Siga estas indicaciones en su casa: Consumo de alcohol No beba alcohol si: Su mdico le indica no hacerlo. Est embarazada, puede estar embarazada o est tratando de [place]. Si bebe alcohol: Limite la cantidad que bebe a lo siguiente: De 0 a 1 bebida por da. Sepa cunta cantidad de alcohol hay en las bebidas que toma. En los [address], una medida equivale a una botella de cerveza de 12 oz (355 ml), un vaso de vino de 5 oz (148 ml) o un vaso de una bebida alcohlica de alta graduacin de 1 oz (44 ml). Estilo de vida No consuma ningn producto que contenga nicotina o tabaco. Estos productos incluyen cigarrillos, tabaco para Theatre manager y aparatos de vapeo, como los Administrator, Civil Service. Si necesita ayuda para dejar de consumir estos productos, consulte al mdico. No consuma drogas. No comparta  agujas. Solicite ayuda a su mdico si necesita apoyo o informacin para abandonar las drogas. Indicaciones generales Realcese los estudios de rutina de [address], dentales y de Wellsite geologist. Mantngase al da con las vacunas. Infrmele a su mdico si: Se siente deprimida con frecuencia. Alguna vez ha sido vctima de [place] o no se siente seguro en su casa. Resumen Adoptar un estilo de vida saludable y recibir atencin preventiva son importantes para promover la salud y Counsellor. Siga las instrucciones del mdico acerca de una dieta saludable, el ejercicio y la realizacin de pruebas o exmenes para Hotel manager. Siga las instrucciones del mdico con respecto al control del colesterol y la presin arterial. Esta informacin no tiene Theme park manager el consejo del mdico. Asegrese de hacerle al mdico cualquier pregunta que tenga. Document Revised: 10/13/2020 Document Reviewed: 10/13/2020 Elsevier Patient Education  2022 Elsevier Inc.     Follow-up: No follow-ups on file.   Georgina Quint, [place]

## 2021-07-11 NOTE — Patient Instructions (Signed)

## 2021-07-12 LAB — HEPATITIS C ANTIBODY
Hepatitis C Ab: NONREACTIVE
SIGNAL TO CUT-OFF: 0.03 (ref <1.00)

## 2022-05-24 NOTE — Progress Notes (Signed)
47 y.o. [id number] Significant Other Hispanic female here for NEW GYN.   Former patient of Dr. Cletis Media.   Having itching on her rectal area, starting last year. The area can feel swollen or thicker than it used to be.  No bleeding.  Concerned about hemorrhoids.  She also noted an itchy lesion on the vulva.  Tried to pop it, but it did not resolve.  Has urinary frequency and potential pelvic organ prolapse. She has declined surgical intervention.    Not able to achieve orgasm.  Has had negative experiences that she believes affect her sexual functioning.   Tired and low energy, so she joined a gym.  Second marriage.  Son from first marriage is 77, goes to school at [employer].  2 additional sons from current marriage, 64 and 71 yo.  Family has their own business. Patient helps with his.    PCP:   Agustina Caroli, MD  Patient's last menstrual period was 05/17/2022.     Period Cycle (Days): 28 Period Duration (Days): 4-5 Period Pattern: Regular Menstrual Flow: Light, Moderate Menstrual Control: Maxi pad Dysmenorrhea: None     Sexually active: Yes.    The current method of family planning is none.    Exercising: Yes.       Smoker:  no  Health Maintenance: Pap:  September 2022 per pt, normal  History of abnormal Pap:  no MMG:  2022 per pt, would like referral.  List of facilities to patient.  Colonoscopy:  n/a BMD:   n/a  Result  n/a TDaP:  2015 Gardasil:   no Screening Labs:  PCP   reports that she has never smoked. She has never used smokeless tobacco. She reports that she does not drink alcohol and does not use drugs.  Past Medical History:  Diagnosis Date   Allergy    Elderly multigravida 11/29/2013   First trimester bleeding 11/29/2013   HSV-1 infection    Medical history non-contributory    Vaginal delivery 11/29/2013    Past Surgical History:  Procedure Laterality Date   Polyp removal     uterine polyp    Current Outpatient Medications  Medication  Sig Dispense Refill   Ascorbic Acid (VITAMIN C) 100 MG CHEW Chew by mouth.     Multiple Vitamins-Minerals (MULTIVITAMIN ADULT EXTRA C PO)      valACYclovir (VALTREX) 1000 MG tablet      No current facility-administered medications for this visit.    Family History  Problem Relation Age of Onset   Diabetes Father    Cancer Maternal Grandfather    Asthma Son    Allergic rhinitis Neg Hx    Angioedema Neg Hx    Eczema Neg Hx    Immunodeficiency Neg Hx    Urticaria Neg Hx     Review of Systems  All other systems reviewed and are negative.   Exam:   BP 116/76 (BP Location: Left Arm, Patient Position: Sitting, Cuff Size: Normal)   Pulse 68   Ht 5' (1.524 m)   Wt 172 lb (78 kg)   LMP 05/17/2022   SpO2 98%   BMI 33.59 kg/m     General appearance: alert, cooperative and appears stated age Head: normocephalic, without obvious abnormality, atraumatic Neck: no adenopathy, supple, symmetrical, trachea midline and thyroid normal to inspection and palpation Lungs: clear to auscultation bilaterally Breasts: normal appearance, no masses or tenderness, No nipple retraction or dimpling, No nipple discharge or bleeding, No axillary adenopathy Heart: regular rate and rhythm  Abdomen: soft, non-tender; no masses, no organomegaly Extremities: extremities normal, atraumatic, no cyanosis or edema Skin: skin color, texture, turgor normal. No rashes or lesions Lymph nodes: cervical, supraclavicular, and axillary nodes normal. Neurologic: grossly normal  Pelvic: External genitalia:  no lesions              No abnormal inguinal nodes palpated.              Urethra:  normal appearing urethra with no masses, tenderness or lesions              Bartholins and Skenes: normal                 Vagina: normal appearing vagina with normal color and discharge, no lesions              Cervix: no lesions.  Vaginal bleeding.               Pap taken: yes Bimanual Exam:  Uterus:  normal size, contour, position,  consistency, mobility, non-tender              Adnexa: no mass, fullness, tenderness              Rectal exam: yes.  Confirms.              Anus:  normal sphincter tone, no lesions  Chaperone was present for exam:  Raquel Sarna  Assessment:   Well woman visit with gynecologic exam. Cervical cancer screening.  Oral HSV.   Anal itching/irritation. No lesions noted.  Decreased orgasm response.  Plan: Mammogram screening discussed. Self breast awareness reviewed. Pap and HR HPV collected Guidelines for Calcium, Vitamin D, regular exercise program including cardiovascular and weight bearing exercise. Rx for Valtrex.   Rx for Trimacinolone.  Information on Awakenings counseling.  Follow up annually and prn.   After visit summary provided.

## 2022-06-06 ENCOUNTER — Other Ambulatory Visit (HOSPITAL_COMMUNITY)
Admission: RE | Admit: 2022-06-06 | Discharge: 2022-06-06 | Disposition: A | Discharge status: Home / Self Care | Payer: Medicaid Other | Source: Ambulatory Visit | Attending: Obstetrics and Gynecology | Admitting: Obstetrics and Gynecology

## 2022-06-06 ENCOUNTER — Ambulatory Visit: Payer: Medicaid Other | Admitting: Obstetrics and Gynecology

## 2022-06-06 ENCOUNTER — Other Ambulatory Visit: Payer: Self-pay | Admitting: Obstetrics and Gynecology

## 2022-06-06 ENCOUNTER — Encounter: Payer: Self-pay | Admitting: Obstetrics and Gynecology

## 2022-06-06 VITALS — BP 116/76 | HR 68 | Ht 60.0 in | Wt 172.0 lb

## 2022-06-06 DIAGNOSIS — Z124 Encounter for screening for malignant neoplasm of cervix: Secondary | ICD-10-CM | POA: Insufficient documentation

## 2022-06-06 DIAGNOSIS — Z1151 Encounter for screening for human papillomavirus (HPV): Secondary | ICD-10-CM

## 2022-06-06 DIAGNOSIS — Z1231 Encounter for screening mammogram for malignant neoplasm of breast: Secondary | ICD-10-CM

## 2022-06-06 DIAGNOSIS — Z01419 Encounter for gynecological examination (general) (routine) without abnormal findings: Secondary | ICD-10-CM

## 2022-06-06 DIAGNOSIS — L290 Pruritus ani: Secondary | ICD-10-CM

## 2022-06-06 MED ORDER — VALACYCLOVIR HCL 1 G PO TABS: ORAL_TABLET | ORAL | 2 refills | Status: DC

## 2022-06-06 MED ORDER — TRIAMCINOLONE ACETONIDE 0.025 % EX OINT: 1.0000 | TOPICAL_OINTMENT | Freq: Two times a day (BID) | CUTANEOUS | 2 refills | Status: DC

## 2022-06-06 NOTE — Patient Instructions (Signed)

## 2022-06-08 ENCOUNTER — Ambulatory Visit
Admission: RE | Admit: 2022-06-08 | Discharge: 2022-06-08 | Disposition: A | Discharge status: Home / Self Care | Payer: Medicaid Other | Source: Ambulatory Visit | Attending: Obstetrics and Gynecology | Admitting: Obstetrics and Gynecology

## 2022-06-08 DIAGNOSIS — Z1231 Encounter for screening mammogram for malignant neoplasm of breast: Secondary | ICD-10-CM

## 2022-06-08 LAB — CYTOLOGY - PAP
Comment: NEGATIVE
Diagnosis: NEGATIVE
High risk HPV: NEGATIVE

## 2022-06-13 ENCOUNTER — Other Ambulatory Visit: Payer: Self-pay | Admitting: Obstetrics and Gynecology

## 2022-06-13 ENCOUNTER — Encounter: Payer: Self-pay | Admitting: Obstetrics and Gynecology

## 2022-06-13 DIAGNOSIS — R928 Other abnormal and inconclusive findings on diagnostic imaging of breast: Secondary | ICD-10-CM

## 2022-06-13 NOTE — Telephone Encounter (Signed)
Pt returned call stating that she was notified that her insurance will require a PA for her Dx breast imaging coming up scheduled on 06/23/22. She was given [place] codes of 26948 and [phone]. Will route to PA specialist.

## 2022-06-13 NOTE — Telephone Encounter (Signed)
Pt had also called and left VM in triage box.   Possible asymmetry noted in left breast, [facility] desires to do additional testing. OK to notify pt of this or if you have anything else to add to provide her with some relief? Please advise. Thanks.   FYI. Doesn't look like she is scheduled for f/u as of yet.

## 2022-06-13 NOTE — Telephone Encounter (Signed)
FYI. Pt notified via phone call and was currently on the other line with [facility] to make appt.  Scheduled for 06/19/2022.

## 2022-06-13 NOTE — Telephone Encounter (Signed)
I sent the patient a result note at 1:08 pm.   The possible asymmetry is nonspecific finding in the left breast.   Please go ahead and assist with scheduling of [facility].

## 2022-06-14 NOTE — Telephone Encounter (Signed)
Call placed to [facility], left detailed message on authorization line requesting return call regarding upcoming Dx MMG and [place] scheduled for 05/23/22. Does imaging require PA?

## 2022-06-14 NOTE — Telephone Encounter (Signed)
Message received from Hope at [facility].  Was advised Medicaid requires PA for Breast US only.   Contact [employer] at [phone]  CPT [id number] Dx: R92.8 Abnormal Mammogram

## 2022-06-15 NOTE — Telephone Encounter (Signed)
Per appt notes: "no PA required." Pt notified and voiced understanding.

## 2022-06-15 NOTE — Telephone Encounter (Signed)
Received VM from pt regarding PA needed for Dx breast imaging. Since I didn't see any documentation as far as approval/denial as of yet and [facility] is out of office, I contacted [facility] to see if they were able to see a PA determination, I received VM so LVMTCB and notified pt of where I am at in process. Pt advised I will hopefully provide her with an update before the end of the day. She voiced understanding.

## 2022-06-19 ENCOUNTER — Ambulatory Visit
Admission: RE | Admit: 2022-06-19 | Discharge: 2022-06-19 | Disposition: A | Discharge status: Home / Self Care | Payer: Medicaid Other | Source: Ambulatory Visit | Attending: Obstetrics and Gynecology | Admitting: Obstetrics and Gynecology

## 2022-06-19 ENCOUNTER — Ambulatory Visit: Payer: Medicaid Other

## 2022-06-19 DIAGNOSIS — R928 Other abnormal and inconclusive findings on diagnostic imaging of breast: Secondary | ICD-10-CM | POA: Diagnosis not present

## 2022-06-19 DIAGNOSIS — N6489 Other specified disorders of breast: Secondary | ICD-10-CM | POA: Diagnosis not present

## 2022-07-03 DIAGNOSIS — H5213 Myopia, bilateral: Secondary | ICD-10-CM | POA: Diagnosis not present

## 2022-07-05 DIAGNOSIS — H524 Presbyopia: Secondary | ICD-10-CM | POA: Diagnosis not present

## 2022-07-05 DIAGNOSIS — H5213 Myopia, bilateral: Secondary | ICD-10-CM | POA: Diagnosis not present

## 2022-07-12 ENCOUNTER — Encounter: Payer: Self-pay | Admitting: Emergency Medicine

## 2022-07-12 ENCOUNTER — Ambulatory Visit (INDEPENDENT_AMBULATORY_CARE_PROVIDER_SITE_OTHER): Payer: Medicaid Other | Admitting: Emergency Medicine

## 2022-07-12 VITALS — BP 118/74 | HR 75 | Temp 98.4°F | Ht 60.0 in | Wt 176.5 lb

## 2022-07-12 DIAGNOSIS — M79641 Pain in right hand: Secondary | ICD-10-CM

## 2022-07-12 DIAGNOSIS — R0609 Other forms of dyspnea: Secondary | ICD-10-CM | POA: Diagnosis not present

## 2022-07-12 DIAGNOSIS — Z0001 Encounter for general adult medical examination with abnormal findings: Secondary | ICD-10-CM | POA: Diagnosis not present

## 2022-07-12 DIAGNOSIS — M79642 Pain in left hand: Secondary | ICD-10-CM | POA: Diagnosis not present

## 2022-07-12 DIAGNOSIS — Z13228 Encounter for screening for other metabolic disorders: Secondary | ICD-10-CM | POA: Diagnosis not present

## 2022-07-12 DIAGNOSIS — Z13220 Encounter for screening for lipoid disorders: Secondary | ICD-10-CM

## 2022-07-12 DIAGNOSIS — Z1329 Encounter for screening for other suspected endocrine disorder: Secondary | ICD-10-CM

## 2022-07-12 DIAGNOSIS — Z130 Encounter for screening for diseases of the blood and blood-forming organs and certain disorders involving the immune mechanism: Secondary | ICD-10-CM | POA: Diagnosis not present

## 2022-07-12 DIAGNOSIS — R55 Syncope and collapse: Secondary | ICD-10-CM | POA: Diagnosis not present

## 2022-07-12 LAB — CBC WITH DIFFERENTIAL/PLATELET
Basophils Absolute: 0 10*3/uL (ref 0.0–0.1)
Basophils Relative: 0.9 % (ref 0.0–3.0)
Eosinophils Absolute: 0.1 10*3/uL (ref 0.0–0.7)
Eosinophils Relative: 2.2 % (ref 0.0–5.0)
HCT: 38.3 % (ref 36.0–46.0)
Hemoglobin: 13.2 g/dL (ref 12.0–15.0)
Lymphocytes Relative: 24.6 % (ref 12.0–46.0)
Lymphs Abs: 1.3 10*3/uL (ref 0.7–4.0)
MCHC: 34.5 g/dL (ref 30.0–36.0)
MCV: 85.2 fl (ref 78.0–100.0)
Monocytes Absolute: 0.3 10*3/uL (ref 0.1–1.0)
Monocytes Relative: 5.7 % (ref 3.0–12.0)
Neutro Abs: 3.6 10*3/uL (ref 1.4–7.7)
Neutrophils Relative %: 66.6 % (ref 43.0–77.0)
Platelets: 179 10*3/uL (ref 150.0–400.0)
RBC: 4.49 Mil/uL (ref 3.87–5.11)
RDW: 13.4 % (ref 11.5–15.5)
WBC: 5.4 10*3/uL (ref 4.0–10.5)

## 2022-07-12 LAB — LIPID PANEL
Cholesterol: 179 mg/dL (ref 0–200)
HDL: 65.2 mg/dL (ref >39.00)
LDL Cholesterol: 85 mg/dL (ref 0–99)
NonHDL: 113.48
Total CHOL/HDL Ratio: 3
Triglycerides: 140 mg/dL (ref 0.0–149.0)
VLDL: 28 mg/dL (ref 0.0–40.0)

## 2022-07-12 LAB — COMPREHENSIVE METABOLIC PANEL
ALT: 17 U/L (ref 0–35)
AST: 15 U/L (ref 0–37)
Albumin: 4.3 g/dL (ref 3.5–5.2)
Alkaline Phosphatase: 43 U/L (ref 39–117)
BUN: 10 mg/dL (ref 6–23)
CO2: 28 mEq/L (ref 19–32)
Calcium: 9.3 mg/dL (ref 8.4–10.5)
Chloride: 104 mEq/L (ref 96–112)
Creatinine, Ser: 0.66 mg/dL (ref 0.40–1.20)
GFR: 106.64 mL/min (ref >60.00)
Glucose, Bld: 88 mg/dL (ref 70–99)
Potassium: 3.9 mEq/L (ref 3.5–5.1)
Sodium: 139 mEq/L (ref 135–145)
Total Bilirubin: 0.5 mg/dL (ref 0.2–1.2)
Total Protein: 6.7 g/dL (ref 6.0–8.3)

## 2022-07-12 LAB — TSH: TSH: 3.45 u[IU]/mL (ref 0.35–5.50)

## 2022-07-12 LAB — SEDIMENTATION RATE: Sed Rate: 5 mm/hr (ref 0–20)

## 2022-07-12 LAB — HEMOGLOBIN A1C: Hgb A1c MFr Bld: 5.3 % (ref 4.6–6.5)

## 2022-07-12 NOTE — Assessment & Plan Note (Signed)
Deconditioning playing a role. Advised to ease into exercise routine and stay well-hydrated Needs to lose some weight.  Diet and nutrition discussed.

## 2022-07-12 NOTE — Assessment & Plan Note (Signed)
No clinical findings of arthritis. Blood work done today including ANA and sedimentation rate Most likely related to activities of daily living Pain management discussed.  May use Tylenol and or Advil as needed.

## 2022-07-12 NOTE — Assessment & Plan Note (Addendum)
Clinically stable.  No red flag signs or symptoms Past medical history of recurrent syncopes since she was a kid Sinus bradycardia on EKG but no ischemic changes. Blood work done today. Advised to stay well-hydrated and improve her nutrition Advised to decrease amount of daily carbohydrate intake and daily calories and increase amount of plant-based protein in her diet. Deconditioning playing a role in her increased heart rate during exercise.

## 2022-07-12 NOTE — Progress Notes (Signed)
Julie Boyd 47 y.o.   Chief Complaint  Patient presents with   Annual Exam    Patient states has been having some discomfort in her right hand, weak, pain, unsure if its arthritis, while standing patient has numbness in her right leg, feels like a pinching sensation  Concern about elevated heart rate when exercise. Heart rate tends to be really high   Patient had a episode of syncope at [place]: This is a 47 y.o. female here for annual exam. Complaining of pain to both hands, right more than left for the past 3 months.  Active with her hands at work.  No injuries. Also complaining of right leg/right hip numbness on and off for 1 year Also complaining of increased heart rate with exercise.  More than usual.  Some dyspnea on exertion.  Denies chest pain on exertion. History of syncopal episodes when she was younger.  Had similar one last week at church. No other complaints or medical concerns today.  HPI   Prior to Admission medications   Medication Sig Start Date End Date Taking? Authorizing Provider  Ascorbic Acid (VITAMIN C) 100 MG CHEW Chew by mouth.   Yes [provider]  Multiple Vitamins-Minerals (MULTIVITAMIN ADULT EXTRA C PO)    Yes [provider]  triamcinolone (KENALOG) 0.025 % ointment Apply 1 Application topically 2 (two) times daily. Use as needed. 06/06/22  Yes Nunzio Cobbs, MD  valACYclovir (VALTREX) 1000 MG tablet Take 2 tablets (2000 mg) po bid x 24 hours. 06/06/22  Yes Nunzio Cobbs, MD    Allergies  Allergen Reactions   Sulfa Antibiotics Rash   Aleve [Naproxen] Rash    Pt says she can take Ibuprofen without a problem    Patient Active Problem List   Diagnosis Date Noted   Seasonal and perennial allergic rhinitis 12/12/2016   Pollen-food allergy syndrome, subsequent encounter 12/12/2016   Spondylolisthesis at L5-S1 level 07/14/2015   BMI 30.0-30.9,adult 05/06/2015    Past  Medical History:  Diagnosis Date   Allergy    Elderly multigravida 11/29/2013   First trimester bleeding 11/29/2013   HSV-1 infection    Medical history non-contributory    Vaginal delivery 11/29/2013    Past Surgical History:  Procedure Laterality Date   Polyp removal     uterine polyp    Social History   Socioeconomic History   Marital status: Significant Other    Spouse name: Shirlee Limerick   Number of children: 3   Years of education: 12+   Highest education level: Not on file  Occupational History   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   Born in [place]. Came to the [place] in 1994, at age 74.   Social Determinants of Health   Financial Resource Strain: Not on file  Food Insecurity: Not on file  Transportation Needs: Not on file  Physical Activity: Not on file  Stress: Not on file  Social Connections: Not on file  Intimate Partner Violence: Not on file    Family History  Problem Relation Age of Onset   Diabetes Father    Cancer Maternal Grandfather    Asthma Son    Allergic rhinitis Neg Hx    Angioedema Neg Hx    Eczema Neg Hx    Immunodeficiency Neg Hx    Urticaria Neg Hx      Review of Systems  Constitutional: Negative.  Negative for chills, fever and malaise/fatigue.  HENT: Negative.  Negative for congestion and sore throat.   Respiratory: Negative.  Negative for cough and shortness of breath.   Cardiovascular: Negative.  Negative for chest pain and palpitations.  Gastrointestinal:  Negative for abdominal pain, diarrhea, nausea and vomiting.   Genitourinary: Negative.  Negative for dysuria and hematuria.  Musculoskeletal:  Positive for joint pain.  Skin: Negative.  Negative for rash.  Neurological: Negative.  Negative for dizziness and headaches.  All other systems reviewed and are negative.    Physical Exam Vitals reviewed.  Constitutional:      Appearance: Normal appearance.  HENT:     Head: Normocephalic.     Right Ear: Tympanic membrane, ear canal and external ear normal.     Left Ear: Tympanic membrane, ear canal and external ear normal.     Mouth/Throat:     Mouth: Mucous membranes are moist.     Pharynx: Oropharynx is clear.  Eyes:     Extraocular Movements: Extraocular movements intact.     Conjunctiva/sclera: Conjunctivae normal.     Pupils: Pupils are equal, round, and reactive to light.  Cardiovascular:     Rate and Rhythm: Normal rate and regular rhythm.     Pulses: Normal pulses.     Heart sounds: Normal heart sounds.  Pulmonary:     Effort: Pulmonary effort is normal.     Breath sounds: Normal breath sounds.  Abdominal:     Palpations: Abdomen is soft.     Tenderness: There is no abdominal tenderness.  Musculoskeletal:        General: No swelling, tenderness or deformity.     Cervical back: No tenderness.     Right lower leg: No edema.     Left lower leg: No edema.  Lymphadenopathy:     Cervical: No cervical adenopathy.  Skin:    General: Skin is warm and dry.  Neurological:     General: No focal deficit present.     Mental Status: She is alert and oriented to person, place, and time.  Psychiatric:        Mood and Affect: Mood normal.        Behavior: Behavior normal.     EKG: Sinus bradycardia with ventricular rate of 53.  No acute ischemic changes. ASSESSMENT & PLAN: Problem List Items Addressed This Visit       Other   Dyspnea on exertion    Deconditioning playing a role. Advised to ease into exercise routine and stay well-hydrated Needs to lose some weight.  Diet and nutrition  discussed.      Bilateral hand pain    No clinical findings of arthritis. Blood work done today including ANA and sedimentation rate Most likely related to activities of daily living Pain management discussed.  May use Tylenol and or Advil as needed.      Relevant Orders   ANA,IFA RA Diag Pnl w/rflx Tit/Patn   Sedimentation rate   Syncope    Clinically stable.  No red flag signs or symptoms Past  medical history of recurrent syncopes since she was a kid Sinus bradycardia on EKG but no ischemic changes. Blood work done today. Advised to stay well-hydrated and improve her nutrition Advised to decrease amount of daily carbohydrate intake and daily calories and increase amount of plant-based protein in her diet. Deconditioning playing a role in her increased heart rate during exercise.      Relevant Orders   EKG 12-Lead   Other Visit Diagnoses     Encounter for general adult medical examination with abnormal findings    -  Primary   Relevant Orders   CBC with Differential   Comprehensive metabolic panel   Hemoglobin A1c   Lipid panel   TSH   Screening for deficiency anemia       Relevant Orders   CBC with Differential   Screening for lipoid disorders       Relevant Orders   Lipid panel   Screening for endocrine, metabolic and immunity disorder       Relevant Orders   Comprehensive metabolic panel   Hemoglobin A1c   TSH      Modifiable risk factors discussed with patient. Anticipatory guidance according to age provided. The following topics were also discussed: Social Determinants of Health Smoking.  Non-smoker Diet and nutrition and need to decrease amount of daily carbohydrate intake and daily calories and increase amount of plant-based protein in her diet Benefits of exercise Cancer family history review Vaccinations review and recommendations Cardiovascular risk assessment and need for blood work Mental health including depression and anxiety Fall and  accident prevention  Patient Instructions  Health Maintenance, Female Adopting a healthy lifestyle and getting preventive care are important in promoting health and wellness. Ask your health care provider about: The right schedule for you to have regular tests and exams. Things you can do on your own to prevent diseases and keep yourself healthy. What should I know about diet, weight, and exercise? Eat a healthy diet  Eat a diet that includes plenty of vegetables, fruits, low-fat dairy products, and lean protein. Do not eat a lot of foods that are high in solid fats, added sugars, or sodium. Maintain a healthy weight Body mass index (BMI) is used to identify weight problems. It estimates body fat based on height and weight. Your health care provider can help determine your BMI and help you achieve or maintain a healthy weight. Get regular exercise Get regular exercise. This is one of the most important things you can do for your health. Most adults should: Exercise for at least 150 minutes each week. The exercise should increase your heart rate and make you sweat (moderate-intensity exercise). Do strengthening exercises at least twice a week. This is in addition to the moderate-intensity exercise. Spend less time sitting. Even light physical activity can be beneficial. Watch cholesterol and blood lipids Have your blood tested for lipids and cholesterol at 45 years of age, then have this test every 5 years. Have your cholesterol levels checked more often if: Your lipid or cholesterol levels are high. You are older than 44 years of age. You are at high risk for heart disease. What should I know about cancer screening? Depending on your health history and family history, you may need to have cancer screening at various ages. This may include screening for: Breast cancer. Cervical cancer. Colorectal cancer. Skin cancer. Lung cancer. What should I know about heart disease, diabetes, and  high blood pressure? Blood pressure and heart disease High blood pressure causes  heart disease and increases the risk of stroke. This is more likely to develop in people who have high blood pressure readings or are overweight. Have your blood pressure checked: Every 3-5 years if you are 45-31 years of age. Every year if you are 82 years old or older. Diabetes Have regular diabetes screenings. This checks your fasting blood sugar level. Have the screening done: Once every three years after age 25 if you are at a normal weight and have a low risk for diabetes. More often and at a younger age if you are overweight or have a high risk for diabetes. What should I know about preventing infection? Hepatitis B If you have a higher risk for hepatitis B, you should be screened for this virus. Talk with your health care provider to find out if you are at risk for hepatitis B infection. Hepatitis C Testing is recommended for: Everyone born from [birth year] through 1965. Anyone with known risk factors for hepatitis C. Sexually transmitted infections (STIs) Get screened for STIs, including gonorrhea and chlamydia, if: You are sexually active and are younger than 45 years of age. You are older than 45 years of age and your health care provider tells you that you are at risk for this type of infection. Your sexual activity has changed since you were last screened, and you are at increased risk for chlamydia or gonorrhea. Ask your health care provider if you are at risk. Ask your health care provider about whether you are at high risk for HIV. Your health care provider may recommend a prescription medicine to help prevent HIV infection. If you choose to take medicine to prevent HIV, you should first get tested for HIV. You should then be tested every 3 months for as long as you are taking the medicine. Pregnancy If you are about to stop having your period (premenopausal) and you may become pregnant, seek counseling  before you get pregnant. Take 400 to 800 micrograms (mcg) of folic acid every day if you become pregnant. Ask for birth control (contraception) if you want to prevent pregnancy. Osteoporosis and menopause Osteoporosis is a disease in which the bones lose minerals and strength with aging. This can result in bone fractures. If you are 6 years old or older, or if you are at risk for osteoporosis and fractures, ask your health care provider if you should: Be screened for bone loss. Take a calcium or vitamin D supplement to lower your risk of fractures. Be given hormone replacement therapy (HRT) to treat symptoms of menopause. Follow these instructions at home: Alcohol use Do not drink alcohol if: Your health care provider tells you not to drink. You are pregnant, may be pregnant, or are planning to become pregnant. If you drink alcohol: Limit how much you have to: 0-1 drink a day. Know how much alcohol is in your drink. In the [place] one drink equals one 12 oz bottle of beer (355 mL), one 5 oz glass of wine (148 mL), or one 1 oz glass of hard liquor (44 mL). Lifestyle Do not use any products that contain nicotine or tobacco. These products include cigarettes, chewing tobacco, and vaping devices, such as e-cigarettes. If you need help quitting, ask your health care provider. Do not use street drugs. Do not share needles. Ask your health care provider for help if you need support or information about quitting drugs. General instructions Schedule regular health, dental, and eye exams. Stay current with your vaccines. Tell your health care provider  if: You often feel depressed. You have ever been abused or do not feel safe at home. Summary Adopting a healthy lifestyle and getting preventive care are important in promoting health and wellness. Follow your health care provider's instructions about healthy diet, exercising, and getting tested or screened for diseases. Follow your health care  provider's instructions on monitoring your cholesterol and blood pressure. This information is not intended to replace advice given to you by your health care provider. Make sure you discuss any questions you have with your health care provider. Document Revised: 09/26/2020 Document Reviewed: 09/26/2020 Elsevier Patient Education  Winter, MD [facility]

## 2022-07-12 NOTE — Patient Instructions (Signed)

## 2022-07-15 LAB — ANA,IFA RA DIAG PNL W/RFLX TIT/PATN
Anti Nuclear Antibody (ANA): POSITIVE — AB
Cyclic Citrullin Peptide Ab: <16 UNITS
Rheumatoid fact SerPl-aCnc: <14 IU/mL (ref <14)

## 2022-07-15 LAB — ANTI-NUCLEAR AB-TITER (ANA TITER): ANA Titer 1: 1:80 {titer} — ABNORMAL HIGH

## 2022-07-16 ENCOUNTER — Other Ambulatory Visit: Payer: Self-pay | Admitting: Emergency Medicine

## 2022-07-16 ENCOUNTER — Encounter: Payer: Self-pay | Admitting: Emergency Medicine

## 2022-07-16 DIAGNOSIS — R768 Other specified abnormal immunological findings in serum: Secondary | ICD-10-CM | POA: Insufficient documentation

## 2022-07-16 DIAGNOSIS — M2550 Pain in unspecified joint: Secondary | ICD-10-CM | POA: Insufficient documentation

## 2022-08-07 NOTE — Progress Notes (Signed)
Office Visit Note  Patient: Julie Boyd             Date of Birth: May 19, 1976           MRN: [id number]             PCP: Horald Pollen, MD Referring: Horald Pollen, * Visit Date: 08/08/2022 Occupation: Stay at home mom  Subjective:  New Patient (Initial Visit) (Patient states she has difficulty grasping things with her hands sometimes. Patient states she feels grinding in both knees. )   History of Present Illness: Julie Boyd is a 47 y.o. female her for evaluation of positive ANA associated with joint pains.  The particular concern is due to some increased pain and stiffness in her hands and difficulty tightly gripping items and sometimes dropping items that started late last year.  She sees some occasional swelling in her hands but not every day.  No erythema or warmth.  Does not notice numbness and no radiation of symptoms.  She has not noticed a big difference of morning versus later in the day.  Pain is mostly just present with use.  She has tried taking ibuprofen 400 mg but did not see a very large difference in the problem.  She does not recall any new illness or injury prior to onset of symptoms.  She has had some joint problems in the past with patellofemoral crepitus for years and had x-rays with Dr. Mardelle Matte about 5 years ago with no particular intervention recommended.  She does not get pain or swelling but notices this grinding sound and sensation in her knees especially with exercises such as squats or leg press or climbing stairs.  Also gets trouble with low back she experiences numbness going down her entire right leg if she stands in a stationary position for more than a few minutes at a time.  This is alleviated by walking and shifting position does not occur while seated or lying down.  She had previous evaluation with some L5-S1 anterolisthesis with pseudodisc herniation and foraminal narrowing from 2017 imaging.  Labs reviewed ANA 1:80  homogenous ESR 5  Activities of Daily Living:  Patient reports morning stiffness for 5 minutes.   Patient Denies nocturnal pain.  Difficulty dressing/grooming: Denies Difficulty climbing stairs: Denies Difficulty getting out of chair: Denies Difficulty using hands for taps, buttons, cutlery, and/or writing: Reports  Review of Systems  Constitutional:  Positive for fatigue.  HENT:  Positive for mouth sores and mouth dryness.   Eyes:  Positive for dryness.  Respiratory:  Positive for shortness of breath.   Cardiovascular:  Positive for palpitations. Negative for chest pain.  Gastrointestinal:  Positive for constipation and diarrhea. Negative for blood in stool.  Endocrine: Positive for increased urination.  Genitourinary:  Negative for involuntary urination.  Musculoskeletal:  Positive for joint pain, joint pain and morning stiffness. Negative for gait problem, joint swelling, myalgias, muscle weakness, muscle tenderness and myalgias.  Skin:  Positive for hair loss. Negative for color change, rash and sensitivity to sunlight.  Allergic/Immunologic: Negative for susceptible to infections.  Neurological:  Negative for dizziness and headaches.  Hematological:  Negative for swollen glands.  Psychiatric/Behavioral:  Negative for depressed mood and sleep disturbance. The patient is nervous/anxious.     PMFS History:  Patient Active Problem List   Diagnosis Date Noted   ANA positive 07/16/2022   Arthralgia of multiple joints 07/16/2022   Dyspnea on exertion 07/12/2022   Bilateral hand pain 07/12/2022  Syncope 07/12/2022   Seasonal and perennial allergic rhinitis 12/12/2016   Pollen-food allergy syndrome, subsequent encounter 12/12/2016   Spondylolisthesis at L5-S1 level 07/14/2015   BMI 30.0-30.9,adult 05/06/2015    Past Medical History:  Diagnosis Date   Allergy    Elderly multigravida 11/29/2013   First trimester bleeding 11/29/2013   HSV-1 infection    Medical history  non-contributory    Vaginal delivery 11/29/2013    Family History  Problem Relation Age of Onset   Diabetes Father    Cancer Maternal Grandfather    Asthma Son    Allergic rhinitis Neg Hx    Angioedema Neg Hx    Eczema Neg Hx    Immunodeficiency Neg Hx    Urticaria Neg Hx    Past Surgical History:  Procedure Laterality Date   Polyp removal     uterine polyp   Social History   Social History Narrative   Lives with her long-time boyfriend and their 3 sons.   Born in [place]. Came to the [place] in 1994, at age 4.   Immunization History  Administered Date(s) Administered   Influenza Split 05/25/2013   Influenza,inj,Quad PF,6+ Mos 05/05/2015, 03/15/2017, 05/20/2018   Influenza-Unspecified 05/25/2013   Tdap 09/17/2013     Objective: Vital Signs: BP 110/71 (BP Location: Right Arm, Patient Position: Sitting, Cuff Size: Normal)   Pulse (!) 59   Resp 14   Ht 5' 0.5" (1.537 m)   Wt 168 lb (76.2 kg)   LMP 07/26/2022   BMI 32.27 kg/m    Physical Exam HENT:     Mouth/Throat:     Mouth: Mucous membranes are moist.     Pharynx: Oropharynx is clear.  Eyes:     Conjunctiva/sclera: Conjunctivae normal.  Cardiovascular:     Rate and Rhythm: Normal rate and regular rhythm.  Pulmonary:     Effort: Pulmonary effort is normal.     Breath sounds: Normal breath sounds.  Musculoskeletal:     Right lower leg: No edema.     Left lower leg: No edema.  Lymphadenopathy:     Cervical: No cervical adenopathy.  Skin:    General: Skin is warm and dry.     Findings: No rash.  Neurological:     Mental Status: She is alert.     Deep Tendon Reflexes: Reflexes normal.  Psychiatric:        Mood and Affect: Mood normal.      Musculoskeletal Exam:  Shoulders full ROM no tenderness or swelling Elbows full ROM no tenderness or swelling Wrists full ROM no tenderness or swelling Fingers full ROM no tenderness or swelling No paraspinal tenderness to palpation over upper and lower back Hip  normal internal and external rotation without pain, no tenderness to lateral hip palpation Knees full ROM no tenderness or swelling, patellofemoral crepitus both knees, negative patellar grind test Ankles full ROM no tenderness or swelling   Investigation: No additional findings.  Imaging: No results found.  Recent Labs: Lab Results  Component Value Date   WBC 5.4 07/12/2022   HGB 13.2 07/12/2022   PLT 179.0 07/12/2022   NA 139 07/12/2022   K 3.9 07/12/2022   CL 104 07/12/2022   CO2 28 07/12/2022   GLUCOSE 88 07/12/2022   BUN 10 07/12/2022   CREATININE 0.66 07/12/2022   BILITOT 0.5 07/12/2022   ALKPHOS 43 07/12/2022   AST 15 07/12/2022   ALT 17 07/12/2022   PROT 6.7 07/12/2022   ALBUMIN 4.3 07/12/2022  CALCIUM 9.3 07/12/2022   GFRAA 133 05/20/2018    Speciality Comments: No specialty comments available.  Procedures:  No procedures performed Allergies: Sulfa antibiotics and Aleve [naproxen]   Assessment / Plan:     Visit Diagnoses: ANA positive  Arthralgia of multiple joints - Plan: RNP Antibody, Anti-Smith antibody, Sjogrens syndrome-A extractable nuclear antibody, Sjogrens syndrome-B extractable nuclear antibody, Anti-DNA antibody, double-stranded, C3 and C4, C-reactive protein  Noticing some joint pain and stiffness in multiple areas diffusely no peripheral joint synovitis appreciable on exam today.  Will check more specific antibody panel as detailed above also serum complements.  Lower pretest suspicion with no other clinical criteria on exam and history.  Also check CRP for evidence of systemic inflammation.  Crepitus on exam and known L5-S1 disease may be having symptoms related to early generalized osteoarthritis.  Bilateral hand pain - Plan: XR Hand 2 View Right, XR Hand 2 View Left  Hand pain and some difficulty with gripping and use not particularly painful and no synovitis appreciated on exam today.  History and exam is not highly suggestive for carpal  tunnel syndrome.  X-rays of bilateral hands checked look unremarkable for inflammatory damage or significant degenerative arthritis.  Chronic pain of both knees - Plan: XR KNEE 3 VIEW RIGHT, XR KNEE 3 VIEW LEFT  More complaint with the crepitus with knee movement not especially painful and no inflammation on exam.  X-ray of bilateral knees checked today demonstrates mild tricompartmental osteoarthritis, left knee with more advanced patellofemoral arthritis and may be changes of chronic patellar tendinitis.  Orders: Orders Placed This Encounter  Procedures   XR Hand 2 View Right   XR Hand 2 View Left   XR KNEE 3 VIEW RIGHT   XR KNEE 3 VIEW LEFT   RNP Antibody   Anti-Smith antibody   Sjogrens syndrome-A extractable nuclear antibody   Sjogrens syndrome-B extractable nuclear antibody   Anti-DNA antibody, double-stranded   C3 and C4   C-reactive protein   No orders of the defined types were placed in this encounter.    Follow-Up Instructions: No follow-ups on file.   Fuller Plan, MD  Note - This record has been created using [employer].  Chart creation errors have been sought, but may not always  have been located. Such creation errors do not reflect on  the standard of medical care.

## 2022-08-08 ENCOUNTER — Ambulatory Visit: Payer: Medicaid Other

## 2022-08-08 ENCOUNTER — Ambulatory Visit (INDEPENDENT_AMBULATORY_CARE_PROVIDER_SITE_OTHER): Payer: Medicaid Other

## 2022-08-08 ENCOUNTER — Encounter: Payer: Self-pay | Admitting: Internal Medicine

## 2022-08-08 ENCOUNTER — Ambulatory Visit: Payer: Medicaid Other | Attending: Internal Medicine | Admitting: Internal Medicine

## 2022-08-08 VITALS — BP 110/71 | HR 59 | Resp 14 | Ht 60.5 in | Wt 168.0 lb

## 2022-08-08 DIAGNOSIS — M79641 Pain in right hand: Secondary | ICD-10-CM

## 2022-08-08 DIAGNOSIS — M25562 Pain in left knee: Secondary | ICD-10-CM

## 2022-08-08 DIAGNOSIS — M25561 Pain in right knee: Secondary | ICD-10-CM

## 2022-08-08 DIAGNOSIS — G8929 Other chronic pain: Secondary | ICD-10-CM

## 2022-08-08 DIAGNOSIS — R768 Other specified abnormal immunological findings in serum: Secondary | ICD-10-CM | POA: Diagnosis not present

## 2022-08-08 DIAGNOSIS — M79642 Pain in left hand: Secondary | ICD-10-CM

## 2022-08-08 DIAGNOSIS — M2550 Pain in unspecified joint: Secondary | ICD-10-CM | POA: Diagnosis not present

## 2022-08-09 LAB — C3 AND C4
C3 Complement: 142 mg/dL (ref 83–193)
C4 Complement: 29 mg/dL (ref 15–57)

## 2022-08-09 LAB — ANTI-DNA ANTIBODY, DOUBLE-STRANDED: ds DNA Ab: 3 IU/mL

## 2022-08-09 LAB — RNP ANTIBODY: Ribonucleic Protein(ENA) Antibody, IgG: <1 AI

## 2022-08-09 LAB — SJOGRENS SYNDROME-A EXTRACTABLE NUCLEAR ANTIBODY: SSA (Ro) (ENA) Antibody, IgG: <1 AI

## 2022-08-09 LAB — SJOGRENS SYNDROME-B EXTRACTABLE NUCLEAR ANTIBODY: SSB (La) (ENA) Antibody, IgG: <1 AI

## 2022-08-09 LAB — C-REACTIVE PROTEIN: CRP: 40.6 mg/L — ABNORMAL HIGH (ref <8.0)

## 2022-08-09 LAB — ANTI-SMITH ANTIBODY: ENA SM Ab Ser-aCnc: <1 AI

## 2022-08-24 ENCOUNTER — Encounter: Payer: Self-pay | Admitting: Internal Medicine

## 2022-08-24 ENCOUNTER — Ambulatory Visit: Payer: Medicaid Other | Attending: Internal Medicine | Admitting: Internal Medicine

## 2022-08-24 VITALS — BP 103/68 | HR 54 | Resp 12 | Ht 60.0 in | Wt 168.0 lb

## 2022-08-24 DIAGNOSIS — M1712 Unilateral primary osteoarthritis, left knee: Secondary | ICD-10-CM | POA: Diagnosis not present

## 2022-08-24 DIAGNOSIS — M7702 Medial epicondylitis, left elbow: Secondary | ICD-10-CM | POA: Diagnosis not present

## 2022-08-24 DIAGNOSIS — R7982 Elevated C-reactive protein (CRP): Secondary | ICD-10-CM | POA: Insufficient documentation

## 2022-08-24 DIAGNOSIS — M2550 Pain in unspecified joint: Secondary | ICD-10-CM

## 2022-08-24 NOTE — Progress Notes (Signed)
Office Visit Note  Patient: Julie Boyd             Date of Birth: 08-13-1975           MRN: [id number]             PCP: Georgina Quint, MD Referring: Georgina Quint, * Visit Date: 08/24/2022   Subjective:  Follow-up (Patient states she has a swollen and painful area around her left elbow. Patient states when she rotates her left arm her forearm will hurt.)   History of Present Illness: Julie Boyd is a 47 y.o. female here for follow up for polyarticular joint pain with positive ANA.  Workup at initial visit demonstrated some left knee osteoarthritis specially with patellofemoral and patellar tendon changes.  Specific antibodies were all negative but CRP was highly elevated at 40.6.  On review she does recall had an oral surgery for gingival recession shortly before our initial clinic visit and wonders if this could be related with her abnormal test result.  About 1 week ago she suddenly developed pain in the left elbow she did not recall any specific injury or event that.  She has been doing some pulling or lifting but really started noticing the pain just while taking a shower.  Localizes to the medial aspect of the elbow and notices pain with pronation movement.  She discussed this with a doctor friend of hers who thinks this is the medial epicondylitis and questions whether or not it is related to her workup for inflammatory arthritis.   Previous HPI 08/08/22 Julie Boyd is a 47 y.o. female her for evaluation of positive ANA associated with joint pains.  The particular concern is due to some increased pain and stiffness in her hands and difficulty tightly gripping items and sometimes dropping items that started late last year.  She sees some occasional swelling in her hands but not every day.  No erythema or warmth.  Does not notice numbness and no radiation of symptoms.  She has not noticed a big difference of morning versus later in the day.  Pain  is mostly just present with use.  She has tried taking ibuprofen 400 mg but did not see a very large difference in the problem.  She does not recall any new illness or injury prior to onset of symptoms.  She has had some joint problems in the past with patellofemoral crepitus for years and had x-rays with Dr. Dion Saucier about 5 years ago with no particular intervention recommended.  She does not get pain or swelling but notices this grinding sound and sensation in her knees especially with exercises such as squats or leg press or climbing stairs.  Also gets trouble with low back she experiences numbness going down her entire right leg if she stands in a stationary position for more than a few minutes at a time.  This is alleviated by walking and shifting position does not occur while seated or lying down.  She had previous evaluation with some L5-S1 anterolisthesis with pseudodisc herniation and foraminal narrowing from 2017 imaging.   Labs reviewed ANA 1:80 homogenous ESR 5   Review of Systems  Constitutional:  Negative for fatigue.  HENT:  Negative for mouth sores and mouth dryness.   Eyes:  Positive for dryness.  Respiratory:  Negative for shortness of breath.   Cardiovascular:  Positive for palpitations. Negative for chest pain.  Gastrointestinal:  Negative for blood in stool, constipation and diarrhea.  Endocrine: Negative for  increased urination.  Genitourinary:  Negative for involuntary urination.  Musculoskeletal:  Positive for joint swelling, morning stiffness and muscle tenderness. Negative for joint pain, gait problem, joint pain, myalgias, muscle weakness and myalgias.  Skin:  Negative for color change, rash, hair loss and sensitivity to sunlight.  Allergic/Immunologic: Negative for susceptible to infections.  Neurological:  Negative for dizziness and headaches.  Hematological:  Negative for swollen glands.  Psychiatric/Behavioral:  Negative for depressed mood and sleep disturbance. The  patient is not nervous/anxious.     PMFS History:  Patient Active Problem List   Diagnosis Date Noted   Medial epicondylitis, left elbow 08/24/2022   CRP elevated 08/24/2022   Patellofemoral arthritis of left knee 08/24/2022   ANA positive 07/16/2022   Arthralgia of multiple joints 07/16/2022   Dyspnea on exertion 07/12/2022   Bilateral hand pain 07/12/2022   Syncope 07/12/2022   Seasonal and perennial allergic rhinitis 12/12/2016   Pollen-food allergy syndrome, subsequent encounter 12/12/2016   Spondylolisthesis at L5-S1 level 07/14/2015   BMI 30.0-30.9,adult 05/06/2015    Past Medical History:  Diagnosis Date   Allergy    Elderly multigravida 11/29/2013   First trimester bleeding 11/29/2013   HSV-1 infection    Medical history non-contributory    Vaginal delivery 11/29/2013    Family History  Problem Relation Age of Onset   Diabetes Father    Cancer Maternal Grandfather    Asthma Son    Allergic rhinitis Neg Hx    Angioedema Neg Hx    Eczema Neg Hx    Immunodeficiency Neg Hx    Urticaria Neg Hx    Past Surgical History:  Procedure Laterality Date   Polyp removal     uterine polyp   Social History   Social History Narrative   Lives with her long-time boyfriend and their 3 sons.   Born in [place]. Came to the [place] in [place], at age 47.   Immunization History  Administered Date(s) Administered   Influenza Split 05/25/2013   Influenza,inj,Quad PF,6+ Mos 05/05/2015, 03/15/2017, 05/20/2018   Influenza-Unspecified 05/25/2013   Tdap 09/17/2013     Objective: Vital Signs: BP 103/68 (BP Location: Left Arm, Patient Position: Sitting, Cuff Size: Normal)   Pulse (!) 54   Resp 12   Ht 5' (1.524 m)   Wt 168 lb (76.2 kg)   LMP 08/17/2022   BMI 32.81 kg/m    Physical Exam Constitutional:      Appearance: She is obese.  Cardiovascular:     Rate and Rhythm: Normal rate and regular rhythm.  Pulmonary:     Effort: Pulmonary effort is normal.     Breath sounds:  Normal breath sounds.  Skin:    General: Skin is warm and dry.     Findings: No rash.  Neurological:     Mental Status: She is alert.  Psychiatric:        Mood and Affect: Mood normal.      Musculoskeletal Exam:  Shoulders full ROM no tenderness or swelling Elbows full ROM, left elbow tenderness to pressure at the medial epicondyle and pain worsened by resisted pronation Wrists full ROM no tenderness or swelling Fingers full ROM no tenderness or swelling Knees full ROM no tenderness or swelling, bilateral patellofemoral crepitus present   Investigation: No additional findings.  Imaging: XR Hand 2 View Left  Result Date: 08/24/2022 X-ray left hand 2 views Radiocarpal carpal joint space appears normal.  MCP PIP and DIP joints appear normal.  No erosions or abnormal  calcifications seen.  Bone mineralization appears normal. Impression Normal hand x-ray  XR Hand 2 View Right  Result Date: 08/24/2022 X-ray right hand 2 views Radiocarpal carpal joint space appears normal.  MCP PIP and DIP joints appear normal.  No erosions or abnormal calcifications seen.  Bone mineralization appears normal. Impression Normal hand x-ray  XR KNEE 3 VIEW LEFT  Result Date: 08/24/2022 X-ray left knee 3 views Mild medial and lateral compartment narrowing there is some possible lateral shift of the tibia or slight valgus deformity.  Small marginal osteophyte present.  Patellofemoral joint with is normal with lateral osteophyte.  Superior osteophyte present.  There is a bony protuberance at the patellar tendon insertion and calcification present.  No visible joint effusion. Impression Tricompartmental osteoarthritis more on lateral aspect of joint, bone spurring and enthesophyte at patellar tendon insertion possibly associated with chronic patellar tendinitis  XR KNEE 3 VIEW RIGHT  Result Date: 08/24/2022 X-ray right knee 3 views There is minimal medial lateral compartment joint space narrowing increase sclerosis  on tibial plateau no lateral osteophytes.  Patellofemoral joint space well-preserved small calcification on the lateral border.  Probable small inferior osteophyte present.  No abnormal calcifications or visible joint effusion. Impression Mild tricompartmental osteoarthritis most in the patellofemoral joint   Recent Labs: Lab Results  Component Value Date   WBC 5.4 07/12/2022   HGB 13.2 07/12/2022   PLT 179.0 07/12/2022   NA 139 07/12/2022   K 3.9 07/12/2022   CL 104 07/12/2022   CO2 28 07/12/2022   GLUCOSE 88 07/12/2022   BUN 10 07/12/2022   CREATININE 0.66 07/12/2022   BILITOT 0.5 07/12/2022   ALKPHOS 43 07/12/2022   AST 15 07/12/2022   ALT 17 07/12/2022   PROT 6.7 07/12/2022   ALBUMIN 4.3 07/12/2022   CALCIUM 9.3 07/12/2022   GFRAA 133 05/20/2018    Speciality Comments: No specialty comments available.  Procedures:  No procedures performed Allergies: Sulfa antibiotics and Aleve [naproxen]   Assessment / Plan:     Visit Diagnoses: Arthralgia of multiple joints CRP elevated - Plan: C-reactive protein  Overall workup was pretty unremarkable besides some mild osteoarthritis and a high CRP.  After discussion I agree it is possible this was caused by recent oral surgery and we will recheck the CRP to see if this remains elevated today.  If it decreased to normal I do not think any additional autoimmune workup needed at this time.  If this remains highly elevated I do not see any active inflammation needing treatment right now but would keep scheduled follow-up for monitoring.  Medial epicondylitis, left elbow  Elbow pain is highly consistent with medial epicondylitis there is no synovitis or other red flags.  Discussed this is a strain injury will often take up to 4 to 6 weeks to resolve with conservative management.  Not severe or limiting regular activities at this time.  Discussed use of warm compress or nonsteroidal anti-inflammatories as needed.  Provided printed instructions  for elbow rehab to start after another week.  If worsening or not seeing any improvement over the expected time course can follow-up as needed.  Patellofemoral arthritis of left knee  Discussed osteoarthritis of the knee including some supplements or over-the-counter anti-inflammatory treatment options.  Discussed most important factors for disease progression including regular physical activity and maintaining healthy body weight..  Orders: Orders Placed This Encounter  Procedures   C-reactive protein   No orders of the defined types were placed in this encounter.  Follow-Up Instructions: Return if symptoms worsen or fail to improve.   Fuller Plan, MD  Note - This record has been created using [employer].  Chart creation errors have been sought, but may not always  have been located. Such creation errors do not reflect on  the standard of medical care.

## 2022-08-25 LAB — C-REACTIVE PROTEIN: CRP: 1.5 mg/L (ref <8.0)

## 2022-08-29 ENCOUNTER — Encounter: Payer: Medicaid Other | Admitting: Internal Medicine

## 2022-10-26 ENCOUNTER — Encounter: Payer: Medicaid Other | Admitting: Internal Medicine

## 2023-02-07 NOTE — Progress Notes (Deleted)
GYNECOLOGY  VISIT   HPI: 47 y.o.   Married  Other Hispanic  female   [phone] with No LMP recorded.   here for   reoccurring itch  GYNECOLOGIC HISTORY: No LMP recorded. Contraception:  n/a Menopausal hormone therapy:  n/a Last mammogram:  06/19/22 Breast Density Cat B, BI-RADS CAT 2 benign Last pap smear:   06/06/22 neg: HR HPV neg        OB History     Gravida  3   Para  3   Term  3   Preterm      AB      Living  3      SAB      IAB      Ectopic      Multiple      Live Births  3              Patient Active Problem List   Diagnosis Date Noted   Medial epicondylitis, left elbow 08/24/2022   CRP elevated 08/24/2022   Patellofemoral arthritis of left knee 08/24/2022   ANA positive 07/16/2022   Arthralgia of multiple joints 07/16/2022   Dyspnea on exertion 07/12/2022   Bilateral hand pain 07/12/2022   Syncope 07/12/2022   Seasonal and perennial allergic rhinitis 12/12/2016   Pollen-food allergy syndrome, subsequent encounter 12/12/2016   Spondylolisthesis at L5-S1 level 07/14/2015   BMI 30.0-30.9,adult 05/06/2015    Past Medical History:  Diagnosis Date   Allergy    Elderly multigravida 11/29/2013   First trimester bleeding 11/29/2013   HSV-1 infection    Medical history non-contributory    Vaginal delivery 11/29/2013    Past Surgical History:  Procedure Laterality Date   Polyp removal     uterine polyp    Current Outpatient Medications  Medication Sig Dispense Refill   Multiple Vitamins-Minerals (MULTIVITAMIN ADULT EXTRA C PO)      triamcinolone (KENALOG) 0.025 % ointment Apply 1 Application topically 2 (two) times daily. Use as needed. 30 g 2   valACYclovir (VALTREX) 1000 MG tablet Take 2 tablets (2000 mg) po bid x 24 hours. 30 tablet 2   No current facility-administered medications for this visit.     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   Born in [place]. Came to the [place] in 1994,  at age 42.   Social Determinants of Health   Financial Resource Strain: Not on file  Food Insecurity: Not on file  Transportation Needs: Not on file  Physical Activity: Not on file  Stress: Not on file  Social Connections: Not on file  Intimate Partner Violence: Not on file    Review of Systems  PHYSICAL EXAMINATION:    There were no vitals taken for this visit.    General appearance: alert, cooperative and appears stated age Head: Normocephalic, without obvious abnormality, atraumatic Neck: no adenopathy, supple, symmetrical, trachea midline and thyroid normal to inspection and palpation Lungs: clear to auscultation bilaterally Breasts: normal appearance, no masses or tenderness, No nipple retraction or dimpling, No nipple discharge or bleeding, No axillary or supraclavicular adenopathy Heart: regular rate and rhythm Abdomen: soft, non-tender, no masses,  no organomegaly Extremities: extremities normal, atraumatic, no cyanosis or edema Skin: Skin color,  texture, turgor normal. No rashes or lesions Lymph nodes: Cervical, supraclavicular, and axillary nodes normal. No abnormal inguinal nodes palpated Neurologic: Grossly normal  Pelvic: External genitalia:  no lesions              Urethra:  normal appearing urethra with no masses, tenderness or lesions              Bartholins and Skenes: normal                 Vagina: normal appearing vagina with normal color and discharge, no lesions              Cervix: no lesions                Bimanual Exam:  Uterus:  normal size, contour, position, consistency, mobility, non-tender              Adnexa: no mass, fullness, tenderness              Rectal exam: {yes no:[id number]}.  Confirms.              Anus:  normal sphincter tone, no lesions  Chaperone was present for exam:  ***  ASSESSMENT     PLAN     An After Visit Summary was printed and given to the patient.  ______ minutes face to face time of which over 50% was spent in counseling.

## 2023-02-21 ENCOUNTER — Ambulatory Visit: Payer: Medicaid Other | Admitting: Obstetrics and Gynecology

## 2023-02-27 NOTE — Progress Notes (Deleted)
GYNECOLOGY  VISIT   HPI: 47 y.o.   Married  Other Hispanic  female   [phone] with No LMP recorded.   here for   reoccurring itch  GYNECOLOGIC HISTORY: No LMP recorded. Contraception:  n/a Menopausal hormone therapy:  n/a Last mammogram:  06/19/22 Breast Density Cat B, BI-RADS CAT 2 benign Last pap smear:   06/06/22 neg: HR HPV neg        OB History     Gravida  3   Para  3   Term  3   Preterm      AB      Living  3      SAB      IAB      Ectopic      Multiple      Live Births  3              Patient Active Problem List   Diagnosis Date Noted   Medial epicondylitis, left elbow 08/24/2022   CRP elevated 08/24/2022   Patellofemoral arthritis of left knee 08/24/2022   ANA positive 07/16/2022   Arthralgia of multiple joints 07/16/2022   Dyspnea on exertion 07/12/2022   Bilateral hand pain 07/12/2022   Syncope 07/12/2022   Seasonal and perennial allergic rhinitis 12/12/2016   Pollen-food allergy syndrome, subsequent encounter 12/12/2016   Spondylolisthesis at L5-S1 level 07/14/2015   BMI 30.0-30.9,adult 05/06/2015    Past Medical History:  Diagnosis Date   Allergy    Elderly multigravida 11/29/2013   First trimester bleeding 11/29/2013   HSV-1 infection    Medical history non-contributory    Vaginal delivery 11/29/2013    Past Surgical History:  Procedure Laterality Date   Polyp removal     uterine polyp    Current Outpatient Medications  Medication Sig Dispense Refill   Multiple Vitamins-Minerals (MULTIVITAMIN ADULT EXTRA C PO)      triamcinolone (KENALOG) 0.025 % ointment Apply 1 Application topically 2 (two) times daily. Use as needed. 30 g 2   valACYclovir (VALTREX) 1000 MG tablet Take 2 tablets (2000 mg) po bid x 24 hours. 30 tablet 2   No current facility-administered medications for this visit.     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   Born in [place]. Came to the [place] in 1994,  at age 42.   Social Determinants of Health   Financial Resource Strain: Not on file  Food Insecurity: Not on file  Transportation Needs: Not on file  Physical Activity: Not on file  Stress: Not on file  Social Connections: Not on file  Intimate Partner Violence: Not on file    Review of Systems  PHYSICAL EXAMINATION:    There were no vitals taken for this visit.    General appearance: alert, cooperative and appears stated age Head: Normocephalic, without obvious abnormality, atraumatic Neck: no adenopathy, supple, symmetrical, trachea midline and thyroid normal to inspection and palpation Lungs: clear to auscultation bilaterally Breasts: normal appearance, no masses or tenderness, No nipple retraction or dimpling, No nipple discharge or bleeding, No axillary or supraclavicular adenopathy Heart: regular rate and rhythm Abdomen: soft, non-tender, no masses,  no organomegaly Extremities: extremities normal, atraumatic, no cyanosis or edema Skin: Skin color,  texture, turgor normal. No rashes or lesions Lymph nodes: Cervical, supraclavicular, and axillary nodes normal. No abnormal inguinal nodes palpated Neurologic: Grossly normal  Pelvic: External genitalia:  no lesions              Urethra:  normal appearing urethra with no masses, tenderness or lesions              Bartholins and Skenes: normal                 Vagina: normal appearing vagina with normal color and discharge, no lesions              Cervix: no lesions                Bimanual Exam:  Uterus:  normal size, contour, position, consistency, mobility, non-tender              Adnexa: no mass, fullness, tenderness              Rectal exam: {yes no:[id number]}.  Confirms.              Anus:  normal sphincter tone, no lesions  Chaperone was present for exam:  ***  ASSESSMENT     PLAN     An After Visit Summary was printed and given to the patient.  ______ minutes face to face time of which over 50% was spent in counseling.

## 2023-03-13 ENCOUNTER — Ambulatory Visit: Payer: Medicaid Other | Admitting: Obstetrics and Gynecology

## 2023-06-04 ENCOUNTER — Other Ambulatory Visit: Payer: Self-pay | Admitting: Obstetrics and Gynecology

## 2023-06-04 DIAGNOSIS — Z1231 Encounter for screening mammogram for malignant neoplasm of breast: Secondary | ICD-10-CM

## 2023-06-21 ENCOUNTER — Ambulatory Visit
Admission: EM | Admit: 2023-06-21 | Discharge: 2023-06-21 | Disposition: A | Discharge status: Home / Self Care | Payer: Medicaid Other | Attending: Nurse Practitioner | Admitting: Nurse Practitioner

## 2023-06-21 ENCOUNTER — Other Ambulatory Visit: Payer: Self-pay

## 2023-06-21 ENCOUNTER — Encounter: Payer: Self-pay | Admitting: Emergency Medicine

## 2023-06-21 DIAGNOSIS — N3001 Acute cystitis with hematuria: Secondary | ICD-10-CM | POA: Insufficient documentation

## 2023-06-21 LAB — POCT URINALYSIS DIP (MANUAL ENTRY)
Bilirubin, UA: NEGATIVE
Glucose, UA: NEGATIVE mg/dL
Ketones, POC UA: NEGATIVE mg/dL
Nitrite, UA: NEGATIVE
Protein Ur, POC: 100 mg/dL — AB
Spec Grav, UA: 1.025
Urobilinogen, UA: 0.2 U/dL
pH, UA: 6

## 2023-06-21 MED ORDER — PHENAZOPYRIDINE HCL 100 MG PO TABS: 100.0000 mg | ORAL_TABLET | Freq: Three times a day (TID) | ORAL | 0 refills | Status: DC | PRN

## 2023-06-21 MED ORDER — NITROFURANTOIN MONOHYD MACRO 100 MG PO CAPS: 100.0000 mg | ORAL_CAPSULE | Freq: Two times a day (BID) | ORAL | 0 refills | Status: AC

## 2023-06-21 NOTE — Discharge Instructions (Signed)
You have a urinary tract infection.  Take the Macrobid as prescribed to treat it.  You can take the Pyridium every 8 hours as needed.  We will contact you if the urine culture shows we need to change the antibiotic. Continue drinking plenty of water.

## 2023-06-21 NOTE — ED Provider Notes (Signed)
[facility]    CSN: [id number] Arrival date & time: 06/21/23  0902      History   Chief Complaint Chief Complaint  Patient presents with   Dysuria    HPI Julie Boyd is a 48 y.o. female.   Patient presents today for 1 day history of dysuria, urinary frequency and urinary urgency, foul urinary odor, chills last night and pink vaginal discharge that she has noticed twice since her symptoms began.  She denies hematuria, abdominal pain, flank pain.  Has taken Tylenol for pain which helped with the chills.  The patient reports history of "bladder problems."  She reports her uterus is "fallen" and she has urinary frequency at baseline.  The urinary frequency has been worse in the past 24 hours than usual.    Past Medical History:  Diagnosis Date   Allergy    Elderly multigravida 11/29/2013   First trimester bleeding 11/29/2013   HSV-1 infection    Medical history non-contributory    Vaginal delivery 11/29/2013    Patient Active Problem List   Diagnosis Date Noted   Medial epicondylitis, left elbow 08/24/2022   CRP elevated 08/24/2022   Patellofemoral arthritis of left knee 08/24/2022   ANA positive 07/16/2022   Arthralgia of multiple joints 07/16/2022   Dyspnea on exertion 07/12/2022   Bilateral hand pain 07/12/2022   Syncope 07/12/2022   Seasonal and perennial allergic rhinitis 12/12/2016   Pollen-food allergy syndrome, subsequent encounter 12/12/2016   Spondylolisthesis at L5-S1 level 07/14/2015   BMI 30.0-30.9,adult 05/06/2015    Past Surgical History:  Procedure Laterality Date   Polyp removal     uterine polyp    OB History     Gravida  3   Para  3   Term  3   Preterm      AB      Living  3      SAB      IAB      Ectopic      Multiple      Live Births  3            Home Medications    Prior to Admission medications   Medication Sig Start Date End Date Taking? Authorizing Provider  nitrofurantoin,  macrocrystal-monohydrate, (MACROBID) 100 MG capsule Take 1 capsule (100 mg total) by mouth 2 (two) times daily for 5 days. 06/21/23 06/26/23 Yes Valentino Nose, NP  phenazopyridine (PYRIDIUM) 100 MG tablet Take 1 tablet (100 mg total) by mouth 3 (three) times daily as needed for pain. 06/21/23  Yes Valentino Nose, NP  Multiple Vitamins-Minerals (MULTIVITAMIN ADULT EXTRA C PO)     [provider]  triamcinolone (KENALOG) 0.025 % ointment Apply 1 Application topically 2 (two) times daily. Use as needed. 06/06/22   Patton Salles, MD  valACYclovir (VALTREX) 1000 MG tablet Take 2 tablets (2000 mg) po bid x 24 hours. 06/06/22   Patton Salles, MD    Family History Family History  Problem Relation Age of Onset   Diabetes Father    Cancer Maternal Grandfather    Asthma Son    Allergic rhinitis Neg Hx    Angioedema Neg Hx    Eczema Neg Hx    Immunodeficiency Neg Hx    Urticaria Neg Hx     Social History Social History   Tobacco Use   Smoking status: Never    Passive exposure: Current   Smokeless tobacco: Never  Vaping  Use   Vaping status: Never Used  Substance Use Topics   Alcohol use: No    Alcohol/week: 0.0 standard drinks of alcohol   Drug use: No     Allergies   Sulfa antibiotics and Aleve [naproxen]   Review of Systems Review of Systems Per HPI  Physical Exam Triage Vital Signs ED Triage Vitals  Encounter Vitals Group     BP 06/21/23 0928 105/75     Systolic BP Percentile --      Diastolic BP Percentile --      Pulse Rate 06/21/23 0928 (!) 58     Resp 06/21/23 0928 20     Temp 06/21/23 0928 97.9 F (36.6 C)     Temp Source 06/21/23 0928 Oral     SpO2 06/21/23 0928 98 %     Weight --      Height --      Head Circumference --      Peak Flow --      Pain Score 06/21/23 0927 0     Pain Loc --      Pain Education --      Exclude from Growth Chart --    No data found.  Updated Vital Signs BP 105/75 (BP Location: Right Arm)    Pulse (!) 58   Temp 97.9 F (36.6 C) (Oral)   Resp 20   LMP 06/14/2023 (Approximate)   SpO2 98%   Breastfeeding No   Visual Acuity Right Eye Distance:   Left Eye Distance:   Bilateral Distance:    Right Eye Near:   Left Eye Near:    Bilateral Near:     Physical Exam Vitals and nursing note reviewed.  Constitutional:      General: She is not in acute distress.    Appearance: She is not toxic-appearing.  HENT:     Mouth/Throat:     Mouth: Mucous membranes are moist.     Pharynx: Oropharynx is clear.  Pulmonary:     Effort: Pulmonary effort is normal. No respiratory distress.  Abdominal:     General: Abdomen is flat. Bowel sounds are normal. There is no distension.     Palpations: Abdomen is soft. There is no mass.     Tenderness: There is no abdominal tenderness. There is no right CVA tenderness, left CVA tenderness or guarding.  Skin:    General: Skin is warm and dry.     Coloration: Skin is not jaundiced or pale.     Findings: No erythema.  Neurological:     Mental Status: She is alert and oriented to person, place, and time.     Motor: No weakness.     Gait: Gait normal.  Psychiatric:        Behavior: Behavior is cooperative.      UC Treatments / Results  Labs (all labs ordered are listed, but only abnormal results are displayed) Labs Reviewed  POCT URINALYSIS DIP (MANUAL ENTRY) - Abnormal; Notable for the following components:      Result Value   Color, UA light yellow (*)    Blood, UA large (*)    Protein Ur, POC =100 (*)    Leukocytes, UA Small (1+) (*)    All other components within normal limits  URINE CULTURE    EKG   Radiology No results found.  Procedures Procedures (including critical care time)  Medications Ordered in UC Medications - No data to display  Initial Impression / Assessment and Plan / UC  Course  I have reviewed the triage vital signs and the nursing notes.  Pertinent labs & imaging results that were available during  my care of the patient were reviewed by me and considered in my medical decision making (see chart for details).   Patient is well-appearing, normotensive, afebrile, not tachycardic, not tachypneic, oxygenating well on room air.    1. Acute cystitis with hematuria Urinalysis today shows large amount of blood, small leukocyte esterase We will treat for UTI with Macrobid twice daily for 5 days Urine culture pending Supportive care discussed including Pyridium every 8 hours as needed for bladder pain/frequency Return and ER precautions discussed with patient  The patient was given the opportunity to ask questions.  All questions answered to their satisfaction.  The patient is in agreement to this plan.    Final Clinical Impressions(s) / UC Diagnoses   Final diagnoses:  Acute cystitis with hematuria     Discharge Instructions      You have a urinary tract infection.  Take the Macrobid as prescribed to treat it.  You can take the Pyridium every 8 hours as needed.  We will contact you if the urine culture shows we need to change the antibiotic. Continue drinking plenty of water.      ED Prescriptions     Medication Sig Dispense Auth. Provider   nitrofurantoin, macrocrystal-monohydrate, (MACROBID) 100 MG capsule Take 1 capsule (100 mg total) by mouth 2 (two) times daily for 5 days. 10 capsule Cathlean Marseilles A, NP   phenazopyridine (PYRIDIUM) 100 MG tablet Take 1 tablet (100 mg total) by mouth 3 (three) times daily as needed for pain. 10 tablet Valentino Nose, NP      PDMP not reviewed this encounter.   Valentino Nose, NP 06/21/23 1021

## 2023-06-21 NOTE — ED Triage Notes (Signed)
Pt reports dysuria, hematuria, "not fully emptying bladder", chills since yesterday. Pt reports history of bladder problems. Denies fever, abd pain.

## 2023-06-23 LAB — URINE CULTURE: Culture: 80000 — AB

## 2023-06-25 ENCOUNTER — Ambulatory Visit
Admission: RE | Admit: 2023-06-25 | Discharge: 2023-06-25 | Disposition: A | Discharge status: Home / Self Care | Payer: Medicaid Other | Source: Ambulatory Visit | Attending: Obstetrics and Gynecology | Admitting: Obstetrics and Gynecology

## 2023-06-25 DIAGNOSIS — Z1231 Encounter for screening mammogram for malignant neoplasm of breast: Secondary | ICD-10-CM

## 2023-06-26 ENCOUNTER — Ambulatory Visit: Payer: Medicaid Other | Admitting: Nurse Practitioner

## 2023-06-27 ENCOUNTER — Encounter: Payer: Self-pay | Admitting: Obstetrics and Gynecology

## 2023-07-08 DIAGNOSIS — H10413 Chronic giant papillary conjunctivitis, bilateral: Secondary | ICD-10-CM | POA: Diagnosis not present

## 2023-07-15 ENCOUNTER — Ambulatory Visit: Payer: Medicaid Other | Attending: Emergency Medicine

## 2023-07-15 ENCOUNTER — Encounter: Payer: Self-pay | Admitting: Emergency Medicine

## 2023-07-15 ENCOUNTER — Ambulatory Visit: Payer: Medicaid Other | Admitting: Emergency Medicine

## 2023-07-15 VITALS — BP 100/64 | HR 57 | Temp 98.0°F | Ht 60.0 in | Wt 171.2 lb

## 2023-07-15 DIAGNOSIS — Z0001 Encounter for general adult medical examination with abnormal findings: Secondary | ICD-10-CM | POA: Diagnosis not present

## 2023-07-15 DIAGNOSIS — R002 Palpitations: Secondary | ICD-10-CM

## 2023-07-15 DIAGNOSIS — Z1329 Encounter for screening for other suspected endocrine disorder: Secondary | ICD-10-CM

## 2023-07-15 DIAGNOSIS — Z1211 Encounter for screening for malignant neoplasm of colon: Secondary | ICD-10-CM

## 2023-07-15 DIAGNOSIS — Z13220 Encounter for screening for lipoid disorders: Secondary | ICD-10-CM

## 2023-07-15 DIAGNOSIS — Z13228 Encounter for screening for other metabolic disorders: Secondary | ICD-10-CM | POA: Diagnosis not present

## 2023-07-15 DIAGNOSIS — Z130 Encounter for screening for diseases of the blood and blood-forming organs and certain disorders involving the immune mechanism: Secondary | ICD-10-CM

## 2023-07-15 LAB — CBC WITH DIFFERENTIAL/PLATELET
Basophils Absolute: 0.1 10*3/uL (ref 0.0–0.1)
Basophils Relative: 0.8 % (ref 0.0–3.0)
Eosinophils Absolute: 0.1 10*3/uL (ref 0.0–0.7)
Eosinophils Relative: 1.8 % (ref 0.0–5.0)
HCT: 40.1 % (ref 36.0–46.0)
Hemoglobin: 13.8 g/dL (ref 12.0–15.0)
Lymphocytes Relative: 20.9 % (ref 12.0–46.0)
Lymphs Abs: 1.4 10*3/uL (ref 0.7–4.0)
MCHC: 34.5 g/dL (ref 30.0–36.0)
MCV: 86.4 fl (ref 78.0–100.0)
Monocytes Absolute: 0.4 10*3/uL (ref 0.1–1.0)
Monocytes Relative: 6 % (ref 3.0–12.0)
Neutro Abs: 4.6 10*3/uL (ref 1.4–7.7)
Neutrophils Relative %: 70.5 % (ref 43.0–77.0)
Platelets: 193 10*3/uL (ref 150.0–400.0)
RBC: 4.64 Mil/uL (ref 3.87–5.11)
RDW: 13.1 % (ref 11.5–15.5)
WBC: 6.6 10*3/uL (ref 4.0–10.5)

## 2023-07-15 LAB — HEMOGLOBIN A1C: Hgb A1c MFr Bld: 5.1 % (ref 4.6–6.5)

## 2023-07-15 LAB — COMPREHENSIVE METABOLIC PANEL
ALT: 14 U/L (ref 0–35)
AST: 11 U/L (ref 0–37)
Albumin: 4.5 g/dL (ref 3.5–5.2)
Alkaline Phosphatase: 44 U/L (ref 39–117)
BUN: 10 mg/dL (ref 6–23)
CO2: 26 meq/L (ref 19–32)
Calcium: 9.4 mg/dL (ref 8.4–10.5)
Chloride: 103 meq/L (ref 96–112)
Creatinine, Ser: 0.63 mg/dL (ref 0.40–1.20)
GFR: 107.09 mL/min (ref >60.00)
Glucose, Bld: 86 mg/dL (ref 70–99)
Potassium: 4.2 meq/L (ref 3.5–5.1)
Sodium: 140 meq/L (ref 135–145)
Total Bilirubin: 0.6 mg/dL (ref 0.2–1.2)
Total Protein: 7.2 g/dL (ref 6.0–8.3)

## 2023-07-15 LAB — TSH: TSH: 4.62 u[IU]/mL (ref 0.35–5.50)

## 2023-07-15 LAB — LIPID PANEL
Cholesterol: 193 mg/dL (ref 0–200)
HDL: 71.6 mg/dL (ref >39.00)
LDL Cholesterol: 89 mg/dL (ref 0–99)
NonHDL: 121.44
Total CHOL/HDL Ratio: 3
Triglycerides: 160 mg/dL — ABNORMAL HIGH (ref 0.0–149.0)
VLDL: 32 mg/dL (ref 0.0–40.0)

## 2023-07-15 NOTE — Progress Notes (Unsigned)
 EP to read.

## 2023-07-15 NOTE — Progress Notes (Signed)
 Gayla Medicus 48 y.o.   Chief Complaint  Patient presents with   Annual Exam    HISTORY OF PRESENT ILLNESS: This is a 48 y.o. female here for annual exam. Complaining of intermittent palpitations lasting several seconds, more frequently than last year.  Almost daily without lightheadedness, dizziness or syncope. Denies chest pain on exertion.  Denies any other associated symptoms.   HPI   Prior to Admission medications   Medication Sig Start Date End Date Taking? Authorizing Provider  Multiple Vitamins-Minerals (MULTIVITAMIN ADULT EXTRA C PO)    Yes [provider]  valACYclovir (VALTREX) 1000 MG tablet Take 2 tablets (2000 mg) po bid x 24 hours. 06/06/22  Yes Patton Salles, MD  phenazopyridine (PYRIDIUM) 100 MG tablet Take 1 tablet (100 mg total) by mouth 3 (three) times daily as needed for pain. Patient not taking: Reported on 07/15/2023 06/21/23   Cathlean Marseilles A, NP  triamcinolone (KENALOG) 0.025 % ointment Apply 1 Application topically 2 (two) times daily. Use as needed. Patient not taking: Reported on 07/15/2023 06/06/22   Patton Salles, MD    Allergies  Allergen Reactions   Sulfa Antibiotics Rash   Aleve [Naproxen] Rash    Pt says she can take Ibuprofen without a problem    Patient Active Problem List   Diagnosis Date Noted   Patellofemoral arthritis of left knee 08/24/2022   ANA positive 07/16/2022   Arthralgia of multiple joints 07/16/2022   Seasonal and perennial allergic rhinitis 12/12/2016   Pollen-food allergy syndrome, subsequent encounter 12/12/2016   Spondylolisthesis at L5-S1 level 07/14/2015   BMI 30.0-30.9,adult 05/06/2015    Past Medical History:  Diagnosis Date   Allergy    Elderly multigravida 11/29/2013   First trimester bleeding 11/29/2013   HSV-1 infection    Medical history non-contributory    Vaginal delivery 11/29/2013    Past Surgical History:  Procedure Laterality Date   Polyp removal      uterine polyp    Social History   Socioeconomic History   Marital status: Married    Spouse name: Annita Brod   Number of children: 3   Years of education: 12+   Highest education level: Not on file  Occupational History   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   Born in [place]. Came to the [place] in 1994, at age 15.   Social Drivers of Corporate investment banker Strain: Not on file  Food Insecurity: Not on file  Transportation Needs: Not on file  Physical Activity: Not on file  Stress: Not on file  Social Connections: Not on file  Intimate Partner Violence: Not on file    Family History  Problem Relation Age of Onset   Diabetes Father    Cancer Maternal Grandfather    Asthma Son    Allergic rhinitis Neg Hx    Angioedema Neg Hx    Eczema Neg Hx  Immunodeficiency Neg Hx    Urticaria Neg Hx      Review of Systems  Constitutional: Negative.  Negative for chills and fever.  HENT: Negative.  Negative for congestion and sore throat.   Respiratory: Negative.  Negative for cough and shortness of breath.   Cardiovascular: Negative.  Negative for chest pain and palpitations.  Gastrointestinal:  Negative for abdominal pain, diarrhea, nausea and vomiting.  Genitourinary: Negative.  Negative for dysuria and hematuria.  Skin: Negative.  Negative for rash.  Neurological: Negative.  Negative for dizziness and headaches.  All other systems reviewed and are negative.   Today's Vitals   07/15/23  0802  BP: 100/64  Pulse: (!) 57  Temp: 98 F (36.7 C)  SpO2: 99%  Weight: 171 lb 3.2 oz (77.7 kg)  Height: 5' (1.524 m)   Body mass index is 33.44 kg/m.   Physical Exam Vitals reviewed.  Constitutional:      Appearance: Normal appearance.  HENT:     Head: Normocephalic.     Right Ear: Tympanic membrane, ear canal and external ear normal.     Left Ear: Tympanic membrane, ear canal and external ear normal.     Mouth/Throat:     Mouth: Mucous membranes are moist.     Pharynx: Oropharynx is clear.  Eyes:     Extraocular Movements: Extraocular movements intact.     Conjunctiva/sclera: Conjunctivae normal.     Pupils: Pupils are equal, round, and reactive to light.  Cardiovascular:     Rate and Rhythm: Normal rate and regular rhythm.     Pulses: Normal pulses.     Heart sounds: Normal heart sounds.  Pulmonary:     Effort: Pulmonary effort is normal.     Breath sounds: Normal breath sounds.  Abdominal:     Palpations: Abdomen is soft.     Tenderness: There is no abdominal tenderness.  Musculoskeletal:     Right lower leg: No edema.     Left lower leg: No edema.  Skin:    General: Skin is warm and dry.  Neurological:     Mental Status: She is alert and oriented to person, place, and time.  Psychiatric:        Mood and Affect: Mood normal.        Behavior: Behavior normal.    EKG: Sinus bradycardia with ventricular rate of 53.  Left axis deviation.  Inverted T waves V4 V5 and V6  ASSESSMENT & PLAN: Problem List Items Addressed This Visit       Other   Palpitations   Becoming more frequent since last evaluation 1 year ago Affecting quality of life due to awareness but pretty much asymptomatic Recommend long-term monitoring with Zio patch for 14 days May need cardiology evaluation Abnormal EKG No family history of heart disease No anginal episodes No hypertension Non-smoker.  No history of diabetes      Relevant Orders   CBC with Differential/Platelet    Comprehensive metabolic panel   TSH   LONG TERM MONITOR (3-14 DAYS)   Other Visit Diagnoses       Encounter for general adult medical examination with abnormal findings    -  Primary   Relevant Orders   CBC with Differential/Platelet   Comprehensive metabolic panel   Hemoglobin A1c   Lipid panel   TSH     Screening for deficiency anemia       Relevant Orders   CBC with Differential/Platelet     Screening for  lipoid disorders       Relevant Orders   Lipid panel     Screening for endocrine, metabolic and immunity disorder       Relevant Orders   Comprehensive metabolic panel   Hemoglobin A1c   TSH     Screening for colon cancer       Relevant Orders   Cologuard      Modifiable risk factors discussed with patient. Anticipatory guidance according to age provided. The following topics were also discussed: Social Determinants of Health Smoking.  Non-smoker Diet and nutrition Benefits of exercise Cancer screening and need for colon cancer screening with Cologuard.  Recent mammogram report reviewed with patient.  Normal. Vaccinations review and recommendations Cardiovascular risk assessment and need for palpitations workup Mental health including depression and anxiety Fall and accident prevention  Patient Instructions  Mantenimiento de Radiographer, therapeutic en las mujeres Health Maintenance, Female Adoptar un estilo de vida saludable y recibir atencin preventiva son importantes para promover la salud y Counsellor. Consulte al mdico sobre: El esquema adecuado para hacerse pruebas y exmenes peridicos. Cosas que puede hacer por su cuenta para prevenir enfermedades y [place] sano. Qu debo saber sobre la dieta, el peso y el ejercicio? Consuma una dieta saludable  Consuma una dieta que incluya muchas verduras, frutas, productos lcteos con bajo contenido de [place] y Associate Professor. No consuma muchos alimentos ricos en grasas slidas, azcares agregados o sodio. Mantenga un  peso saludable El ndice de masa muscular [facility]) se [place] para identificar problemas de [place]. Proporciona una estimacin de la grasa corporal basndose en el peso y la altura. Su mdico puede ayudarle a Engineer, site IMC y a Personnel officer o Pharmacologist un peso saludable. Haga ejercicio con regularidad Haga ejercicio con regularidad. Esta es una de las prcticas ms importantes que puede hacer por su salud. La [employer] de los adultos deben seguir estas pautas: Education officer, environmental, al menos, 150 minutos de actividad fsica por semana. El ejercicio debe aumentar la frecuencia cardaca y Media planner transpirar (ejercicio de intensidad moderada). Hacer ejercicios de fortalecimiento por lo [facility] por semana. Agregue esto a su plan de ejercicio de intensidad moderada. Pase menos tiempo sentada. Incluso la actividad fsica ligera puede ser beneficiosa. Controle sus niveles de colesterol y lpidos en la sangre Comience a realizarse anlisis de lpidos y Oncologist en la sangre a los 20 aos y luego reptalos cada 5 aos. Hgase controlar los niveles de colesterol con mayor frecuencia si: Sus niveles de lpidos y colesterol son altos. Es mayor de 40 aos. Presenta un alto riesgo de padecer enfermedades cardacas. Qu debo saber sobre las pruebas de deteccin del cncer? Segn su historia clnica y sus antecedentes familiares, es posible que deba realizarse pruebas de deteccin del cncer en diferentes edades. Esto puede incluir pruebas de deteccin de lo siguiente: Cncer de mama. Cncer de cuello uterino. Cncer colorrectal. Cncer de piel. Cncer de pulmn. Qu debo saber sobre la enfermedad cardaca, la diabetes y la hipertensin arterial? Presin arterial y enfermedad cardaca La hipertensin arterial causa enfermedades cardacas y [place] el riesgo de accidente cerebrovascular. Es ms probable que esto se manifieste en las personas que tienen lecturas de presin arterial alta o tienen sobrepeso. Hgase controlar  la presin arterial: Cada 3 a 5 aos si tiene entre 18 y 73 aos. Todos los aos si es mayor de 40 aos. Diabetes Realcese exmenes de deteccin de la diabetes con regularidad. Este anlisis revisa el nivel de azcar en la sangre en [place]. Hgase  las pruebas de deteccin: Cada tres aos despus de los 40 aos de edad si tiene un peso normal y un bajo riesgo de padecer diabetes. Con ms frecuencia y a partir de [place] edad inferior si tiene sobrepeso o un alto riesgo de padecer diabetes. Qu debo saber sobre la prevencin de infecciones? Hepatitis B Si tiene un riesgo ms alto de contraer hepatitis B, debe someterse a un examen de deteccin de este virus. Hable con el mdico para averiguar si tiene riesgo de contraer la infeccin por hepatitis B. Hepatitis C Se recomienda el anlisis a: [employer] 1945 y 1965. Todas las personas que tengan un riesgo de haber contrado hepatitis C. Enfermedades de transmisin sexual (ETS) Hgase las pruebas de Airline pilot de ITS, incluidas la gonorrea y la clamidia, si: Es sexualmente activa y es menor de [address]. Es mayor de [address], y Public affairs consultant informa que corre riesgo de tener este tipo de infecciones. La actividad sexual ha cambiado desde que le hicieron la ltima prueba de deteccin y tiene un riesgo mayor de Warehouse manager clamidia o Copy. Pregntele al mdico si usted tiene riesgo. Pregntele al mdico si usted tiene un alto riesgo de Primary school teacher VIH. El mdico tambin puede recomendarle un medicamento recetado para ayudar a evitar la infeccin por el VIH. Si elige tomar medicamentos para prevenir el VIH, primero debe [employer] de deteccin del VIH. Luego debe hacerse anlisis cada 3 meses mientras est tomando los medicamentos. Embarazo Si est por dejar de Armed forces training and education officer (fase premenopusica) y usted puede quedar [place], busque asesoramiento antes de [place]. Tome de 400 a 800 microgramos (mcg) de cido [employer]	[place] si [place]. Pida mtodos de control de la natalidad (anticonceptivos) si desea evitar un embarazo no deseado. Osteoporosis y [place] La osteoporosis es una enfermedad en la que los huesos pierden los minerales y la fuerza por el avance de la edad. El resultado pueden ser fracturas en los [place]. Si tiene 65 aos o ms, o si est en riesgo de sufrir osteoporosis y fracturas, pregunte a su mdico si debe: Hacerse pruebas de deteccin de prdida sea. Tomar un suplemento de calcio o de vitamina D para reducir el riesgo de fracturas. Recibir terapia de reemplazo hormonal (TRH) para tratar los sntomas de la menopausia. Siga estas indicaciones en su casa: Consumo de alcohol No beba alcohol si: Su mdico le indica no hacerlo. Est embarazada, puede estar embarazada o est tratando de [place]. Si bebe alcohol: Limite la cantidad que bebe a lo siguiente: De 0 a 1 bebida por da. Sepa cunta cantidad de alcohol hay en las bebidas que toma. En los [address], una medida equivale a una botella de cerveza de 12 oz (355 ml), un vaso de vino de 5 oz (148 ml) o un vaso de una bebida alcohlica de alta graduacin de 1 oz (44 ml). Estilo de vida No consuma ningn producto que contenga nicotina o tabaco. Estos productos incluyen cigarrillos, tabaco para Theatre manager y aparatos de vapeo, como los Administrator, Civil Service. Si necesita ayuda para dejar de consumir estos productos, consulte al mdico. No consuma drogas. No comparta agujas. Solicite ayuda a su mdico si necesita apoyo o informacin para abandonar las drogas. Indicaciones generales Realcese los estudios de rutina de [address], dentales y de Wellsite geologist. Mantngase al da con las vacunas. Infrmele a su mdico si: Se siente deprimida con frecuencia. Alguna vez ha sido vctima de [place] o no se siente seguro en  su casa. Resumen Adoptar un estilo de vida saludable y recibir atencin preventiva son importantes para promover  la salud y Counsellor. Siga las instrucciones del mdico acerca de una dieta saludable, el ejercicio y la realizacin de pruebas o exmenes para Hotel manager. Siga las instrucciones del mdico con respecto al control del colesterol y la presin arterial. Esta informacin no tiene Theme park manager el consejo del mdico. Asegrese de hacerle al mdico cualquier pregunta que tenga. Document Revised: 10/13/2020 Document Reviewed: 10/13/2020 Elsevier Patient Education  2024 Elsevier Inc.     Edwina Barth, MD [facility]

## 2023-07-15 NOTE — Assessment & Plan Note (Signed)
 Becoming more frequent since last evaluation 1 year ago Affecting quality of life due to awareness but pretty much asymptomatic Recommend long-term monitoring with Zio patch for 14 days May need cardiology evaluation Abnormal EKG No family history of heart disease No anginal episodes No hypertension Non-smoker.  No history of diabetes

## 2023-07-15 NOTE — Patient Instructions (Signed)
 Mantenimiento de Radiographer, therapeutic en las mujeres Health Maintenance, Female Adoptar un estilo de vida saludable y recibir atencin preventiva son importantes para promover la salud y Counsellor. Consulte al mdico sobre: El esquema adecuado para hacerse pruebas y exmenes peridicos. Cosas que puede hacer por su cuenta para prevenir enfermedades y [place] sano. Qu debo saber sobre la dieta, el peso y el ejercicio? Consuma una dieta saludable  Consuma una dieta que incluya muchas verduras, frutas, productos lcteos con bajo contenido de [place] y Associate Professor. No consuma muchos alimentos ricos en grasas slidas, azcares agregados o sodio. Mantenga un peso saludable El ndice de masa muscular [facility]) se [place] para identificar problemas de [place]. Proporciona una estimacin de la grasa corporal basndose en el peso y la altura. Su mdico puede ayudarle a Engineer, site IMC y a Personnel officer o Pharmacologist un peso saludable. Haga ejercicio con regularidad Haga ejercicio con regularidad. Esta es una de las prcticas ms importantes que puede hacer por su salud. La [employer] de los adultos deben seguir estas pautas: Education officer, environmental, al menos, 150 minutos de actividad fsica por semana. El ejercicio debe aumentar la frecuencia cardaca y Media planner transpirar (ejercicio de intensidad moderada). Hacer ejercicios de fortalecimiento por lo [facility] por semana. Agregue esto a su plan de ejercicio de intensidad moderada. Pase menos tiempo sentada. Incluso la actividad fsica ligera puede ser beneficiosa. Controle sus niveles de colesterol y lpidos en la sangre Comience a realizarse anlisis de lpidos y Oncologist en la sangre a los 20 aos y luego reptalos cada 5 aos. Hgase controlar los niveles de colesterol con mayor frecuencia si: Sus niveles de lpidos y colesterol son altos. Es mayor de 40 aos. Presenta un alto riesgo de padecer enfermedades cardacas. Qu debo saber sobre las pruebas de deteccin del  cncer? Segn su historia clnica y sus antecedentes familiares, es posible que deba realizarse pruebas de deteccin del cncer en diferentes edades. Esto puede incluir pruebas de deteccin de lo siguiente: Cncer de mama. Cncer de cuello uterino. Cncer colorrectal. Cncer de piel. Cncer de pulmn. Qu debo saber sobre la enfermedad cardaca, la diabetes y la hipertensin arterial? Presin arterial y enfermedad cardaca La hipertensin arterial causa enfermedades cardacas y [place] el riesgo de accidente cerebrovascular. Es ms probable que esto se manifieste en las personas que tienen lecturas de presin arterial alta o tienen sobrepeso. Hgase controlar la presin arterial: Cada 3 a 5 aos si tiene entre 18 y 50 aos. Todos los aos si es mayor de 40 aos. Diabetes Realcese exmenes de deteccin de la diabetes con regularidad. Este anlisis revisa el nivel de azcar en la sangre en [place]. Hgase las pruebas de deteccin: Cada tres aos despus de los 40 aos de edad si tiene un peso normal y un bajo riesgo de padecer diabetes. Con ms frecuencia y a partir de [place] edad inferior si tiene sobrepeso o un alto riesgo de padecer diabetes. Qu debo saber sobre la prevencin de infecciones? Hepatitis B Si tiene un riesgo ms alto de contraer hepatitis B, debe someterse a un examen de deteccin de este virus. Hable con el mdico para averiguar si tiene riesgo de contraer la infeccin por hepatitis B. Hepatitis C Se recomienda el anlisis a: [employer] 1945 y 1965. Todas las personas que tengan un riesgo de haber contrado hepatitis C. Enfermedades de transmisin sexual (ETS) Hgase las pruebas de Airline pilot de ITS, incluidas la gonorrea y la clamidia, si: Es sexualmente activa y es Adult nurse de 24  aos. Es mayor de 24 aos, y el mdico le informa que corre riesgo de tener este tipo de infecciones. La actividad sexual ha cambiado desde que le hicieron la ltima prueba de  deteccin y tiene un riesgo mayor de Warehouse manager clamidia o Copy. Pregntele al mdico si usted tiene riesgo. Pregntele al mdico si usted tiene un alto riesgo de Primary school teacher VIH. El mdico tambin puede recomendarle un medicamento recetado para ayudar a evitar la infeccin por el VIH. Si elige tomar medicamentos para prevenir el VIH, primero debe [employer] de deteccin del VIH. Luego debe hacerse anlisis cada 3 meses mientras est tomando los medicamentos. Embarazo Si est por dejar de Armed forces training and education officer (fase premenopusica) y usted puede quedar [place], busque asesoramiento antes de [place]. Tome de 400 a 800 microgramos (mcg) de cido [employer] si [place]. Pida mtodos de control de la natalidad (anticonceptivos) si desea evitar un embarazo no deseado. Osteoporosis y [place] La osteoporosis es una enfermedad en la que los huesos pierden los minerales y la fuerza por el avance de la edad. El resultado pueden ser fracturas en los [place]. Si tiene 65 aos o ms, o si est en riesgo de sufrir osteoporosis y fracturas, pregunte a su mdico si debe: Hacerse pruebas de deteccin de prdida sea. Tomar un suplemento de calcio o de vitamina D para reducir el riesgo de fracturas. Recibir terapia de reemplazo hormonal (TRH) para tratar los sntomas de la menopausia. Siga estas indicaciones en su casa: Consumo de alcohol No beba alcohol si: Su mdico le indica no hacerlo. Est embarazada, puede estar embarazada o est tratando de [place]. Si bebe alcohol: Limite la cantidad que bebe a lo siguiente: De 0 a 1 bebida por da. Sepa cunta cantidad de alcohol hay en las bebidas que toma. En los [address], una medida equivale a una botella de cerveza de 12 oz (355 ml), un vaso de vino de 5 oz (148 ml) o un vaso de una bebida alcohlica de alta graduacin de 1 oz (44 ml). Estilo de vida No consuma ningn producto que contenga nicotina o tabaco. Estos  productos incluyen cigarrillos, tabaco para Theatre manager y aparatos de vapeo, como los Administrator, Civil Service. Si necesita ayuda para dejar de consumir estos productos, consulte al mdico. No consuma drogas. No comparta agujas. Solicite ayuda a su mdico si necesita apoyo o informacin para abandonar las drogas. Indicaciones generales Realcese los estudios de rutina de [address], dentales y de Wellsite geologist. Mantngase al da con las vacunas. Infrmele a su mdico si: Se siente deprimida con frecuencia. Alguna vez ha sido vctima de [place] o no se siente seguro en su casa. Resumen Adoptar un estilo de vida saludable y recibir atencin preventiva son importantes para promover la salud y Counsellor. Siga las instrucciones del mdico acerca de una dieta saludable, el ejercicio y la realizacin de pruebas o exmenes para Hotel manager. Siga las instrucciones del mdico con respecto al control del colesterol y la presin arterial. Esta informacin no tiene Theme park manager el consejo del mdico. Asegrese de hacerle al mdico cualquier pregunta que tenga. Document Revised: 10/13/2020 Document Reviewed: 10/13/2020 Elsevier Patient Education  2024 [employer].

## 2023-07-16 ENCOUNTER — Other Ambulatory Visit: Payer: Self-pay | Admitting: Radiology

## 2023-07-16 DIAGNOSIS — R002 Palpitations: Secondary | ICD-10-CM

## 2023-07-29 ENCOUNTER — Encounter: Payer: Self-pay | Admitting: Emergency Medicine

## 2023-07-30 NOTE — Telephone Encounter (Signed)
 Patient needs to request an replacement ZIO device.  Thanks.

## 2023-08-08 LAB — COLOGUARD

## 2023-09-25 ENCOUNTER — Ambulatory Visit: Payer: Medicaid Other | Admitting: Obstetrics and Gynecology

## 2023-09-25 NOTE — Progress Notes (Signed)
 48 y.o. [id number] Married Hispanic female here for annual exam. Would like to discuss having a hysterectomy, has been having bladder issues, also has a spot on inside of her L thigh she would like to have looked at.   Since birth of her child in 2015, she is having urinary frequency at night.  Up one to 3 times per night.  She also has daytime frequency.   Voids up to every 6 hours.  Leaks urine with sneezing, jumping and running. Wears a pad.   States she has been told in the past, she may need a bladder tack.  She has done urodynamic testing with a prior provider, results not available today.    Some constipation.  No accidental leakage of stool.  Periods are not a concern per patient.   Does not feel a bulge.   No concerns with sexual functioning.   She declines future childbearing.   1 coffee in the am.  Sometimes tea and coffee.  Drings about 6 bottles H2O per day.   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  2 graduations coming up for her children.   PCP: Elvira Hammersmith, MD   Patient's last menstrual period was 09/17/2023 (exact date).     Period Cycle (Days): 28 Period Duration (Days): 1-3 Period Pattern: Regular Menstrual Flow: Moderate Menstrual Control: Maxi pad Dysmenorrhea: None     Sexually active: Yes.    The current method of family planning is coitus interruptus.    Menopausal hormone therapy:  n/a Exercising: Yes.    Jogging and landscaping  Smoker:  no  OB History  Gravida Para Term Preterm AB Living  3 3 3   3   SAB IAB Ectopic Multiple Live Births      3    # Outcome Date GA Lbr Len/2nd Weight Sex Type Anes PTL Lv  3 Term 11/29/13 [redacted]w[redacted]d 05:54 / 00:06 7 lb 0.5 oz (3.19 kg) M Vag-Spont None  LIV  2 Term 12/16/05    M Vag-Spont EPI  LIV  1 Term 12/25/01    M Vag-Spont EPI  LIV     HEALTH MAINTENANCE: Last 2 paps:  06/06/22 neg HR HPV neg  History of abnormal Pap or positive HPV:  no Mammogram:   06/25/23 Breast Density Cat 1,  BIRADS Cat 1 neg  Colonoscopy:  she did Cologuard with her PCP.  Specimen could not be tested.   Bone Density:  n/a  Result  n/a   Immunization History  Administered Date(s) Administered   Influenza Split 05/25/2013   Influenza,inj,Quad PF,6+ Mos 05/05/2015, 03/15/2017, 05/20/2018   Influenza-Unspecified 05/25/2013   Tdap 09/17/2013      reports that she has never smoked. She has been exposed to tobacco smoke. She has never used smokeless tobacco. She reports that she does not drink alcohol and does not use drugs.  Past Medical History:  Diagnosis Date   Allergy    Elderly multigravida 11/29/2013   First trimester bleeding 11/29/2013   HSV-1 infection    Medical history non-contributory    Vaginal delivery 11/29/2013    Past Surgical History:  Procedure Laterality Date   Polyp removal     uterine polyp    Current Outpatient Medications  Medication Sig Dispense Refill   Multiple Vitamins-Minerals (MULTIVITAMIN ADULT EXTRA C PO)      valACYclovir  (VALTREX ) 1000 MG tablet Take 2 tablets (2000 mg) po bid x 24 hours. 30 tablet 2   phenazopyridine  (PYRIDIUM ) 100 MG  tablet Take 1 tablet (100 mg total) by mouth 3 (three) times daily as needed for pain. (Patient not taking: Reported on 09/26/2023) 10 tablet 0   triamcinolone  (KENALOG ) 0.025 % ointment Apply 1 Application topically 2 (two) times daily. Use as needed. (Patient not taking: Reported on 09/26/2023) 30 g 2   No current facility-administered medications for this visit.    ALLERGIES: Sulfa antibiotics and Aleve [naproxen]  Family History  Problem Relation Age of Onset   Diabetes Father    Cancer Maternal Grandfather    Asthma Son    Allergic rhinitis Neg Hx    Angioedema Neg Hx    Eczema Neg Hx    Immunodeficiency Neg Hx    Urticaria Neg Hx     Review of Systems  All other systems reviewed and are negative.   PHYSICAL EXAM:  BP 114/82 (BP Location: Left Arm, Patient Position: Sitting, Cuff Size: Normal)   Pulse  60   Ht 5' 1.5" (1.562 m)   Wt 173 lb (78.5 kg)   LMP 09/17/2023 (Exact Date)   SpO2 95%   BMI 32.16 kg/m     General appearance: alert, cooperative and appears stated age Head: normocephalic, without obvious abnormality, atraumatic Neck: no adenopathy, supple, symmetrical, trachea midline and thyroid  normal to inspection and palpation Lungs: clear to auscultation bilaterally Breasts: normal appearance, no masses or tenderness, No nipple retraction or dimpling, No nipple discharge or bleeding, No axillary adenopathy Heart: regular rate and rhythm Abdomen: soft, non-tender; no masses, no organomegaly Extremities: extremities normal, atraumatic, no cyanosis or edema Skin: skin color, texture, turgor normal. No rashes or lesions Lymph nodes: cervical, supraclavicular, and axillary nodes normal. Neurologic: grossly normal  Pelvic: External genitalia:  no lesions              No abnormal inguinal nodes palpated.              Urethra:  normal appearing urethra with no masses, tenderness or lesions              Bartholins and Skenes: normal                 Vagina: normal appearing vagina with normal color and discharge, no lesions.  Second degree cystocele and second degree rectocele with Valsalva.  Urinary incontinence noted with Valsalva.               Cervix: no lesions.  Uterus is well supported.              Pap taken: no Bimanual Exam:  Uterus:  normal size, contour, position, consistency, mobility, non-tender              Adnexa: no mass, fullness, tenderness              Rectal exam: yes.  Confirms.              Anus:  normal sphincter tone, no lesions  Chaperone was present for exam:  Cottie Diss, CMA  ASSESSMENT: Well woman visit with gynecologic exam. Second degree cystocele and second degree rectocele.  Stress incontinence. Colon cancer screening.  Oral HSV. PHQ-2: 0  PLAN: Mammogram screening discussed. Self breast awareness reviewed. Pap and HRV collected:  no.  Due in  2029. Guidelines for Calcium, Vitamin D , regular exercise program including cardiovascular and weight bearing exercise. Medication refills:  Valtrex . Labs with PCP.  Referral for colonoscopy placed.   Follow up:  yearly and prn.     Additional  counseling given.  yes. 15 min  total time was spent for this patient encounter, including preparation, face-to-face counseling with the patient, coordination of care, and documentation of the encounter in addition to doing the well woman visit with gynecologic exam.   We discussed pelvic organ prolapse, and I used a 3D model to explain her anatomic change and where the weaknesses are in the pelvic floor.  Potential anterior and posterior colporrhaphy with midurethral sling for urinary stress incontinence +/- hysterectomy also reviewed.   Will make referral to physician provider at [facility].   Her leg was not evaluated as the focus of the visit became her pelvic organ prolapse and urinary incontinence.

## 2023-09-26 ENCOUNTER — Encounter: Payer: Self-pay | Admitting: Obstetrics and Gynecology

## 2023-09-26 ENCOUNTER — Ambulatory Visit (INDEPENDENT_AMBULATORY_CARE_PROVIDER_SITE_OTHER): Payer: Medicaid Other | Admitting: Obstetrics and Gynecology

## 2023-09-26 VITALS — BP 114/82 | HR 60 | Ht 61.5 in | Wt 173.0 lb

## 2023-09-26 DIAGNOSIS — Z1211 Encounter for screening for malignant neoplasm of colon: Secondary | ICD-10-CM

## 2023-09-26 DIAGNOSIS — Z01419 Encounter for gynecological examination (general) (routine) without abnormal findings: Secondary | ICD-10-CM

## 2023-09-26 DIAGNOSIS — N816 Rectocele: Secondary | ICD-10-CM

## 2023-09-26 DIAGNOSIS — N393 Stress incontinence (female) (male): Secondary | ICD-10-CM | POA: Diagnosis not present

## 2023-09-26 DIAGNOSIS — N8110 Cystocele, unspecified: Secondary | ICD-10-CM

## 2023-09-26 DIAGNOSIS — Z1331 Encounter for screening for depression: Secondary | ICD-10-CM

## 2023-09-26 MED ORDER — VALACYCLOVIR HCL 1 G PO TABS: ORAL_TABLET | ORAL | 2 refills | Status: AC

## 2023-09-26 NOTE — Patient Instructions (Addendum)
 Urinary Incontinence: What to Know Urinary incontinence, or UI, happens if you can't always control when you pee. If you think you have UI, it's important to talk to your health care provider. There're things that can be done to help treat your problem. What are the different types of UI? There are many types of UI. You may have more than one type: Stress UI. You pee all of a sudden when doing activities such exercising. You may also pee when you cough or laugh. Urge UI. You have a sudden urge to pee and you begin peeing before you get to a bathroom. Overflow UI. You feel the need to pee often but only pee a small amount. You also always leak urine. Functional UI. You pee when you can't get to the bathroom in time due to: A physical problem like a bladder injury or arthritis. A disease that affects the way your brain works, like Alzheimer's disease. Nocturnal UI. You pee while you sleep. What are the causes?     Medicines. Infections. Trouble pooping (constipation). Bladder muscles that squeeze too much or too soon (overactive bladder). Weak bladder muscles. Weak pelvic floor muscles. These muscles provide support for the bladder, intestines, and the uterus. Enlarged prostate in males. The prostate sits around the tube that drains pee from the bladder (urethra). If the prostate gets big, it can pinch the urethra. If the urethra is blocked, the bladder gets weak and can't empty all the way. Surgery. Diseases that affect the nerves or spinal cord. What puts you at risk for UI? Age. The older you are, the higher the risk. Being overweight or having obesity. Being inactive and not getting much exercise. Pregnancy and childbirth. Menopause. Long-term coughing. Treatment for prostate cancer. How is UI diagnosed? A physical exam. Pee tests. X-rays of your kidneys or bladder. Ultrasound or CT scan. Cystoscopy. In this procedure, a provider inserts a tube with a light and camera  (cystoscope) through the urethra and into the bladder to check for problems. Urodynamic testing. These tests check how well the bladder can store and release pee. Your provider may ask you to write down your symptoms. You may be asked to write when you pee and how much you pee. Where to find more information [employer] Diabetes and Digestive and Kidney Diseases: CarFlippers.tn American Urology Association: www.urologyhealth.org This information is not intended to replace advice given to you by your health care provider. Make sure you discuss any questions you have with your health care provider. Document Revised: 04/10/2023 Document Reviewed: 01/11/2023 Elsevier Patient Education  2024 Elsevier Inc.  Pelvic Organ Prolapse Pelvic organ prolapse is a condition in women that involves the stretching, bulging, or dropping of pelvic organs into an abnormal position, past the opening of the vagina. It happens when the muscles and tissues that surround and support pelvic structures become weak or stretched. Pelvic organ prolapse can involve the: Vagina (vaginal prolapse). Uterus (uterine prolapse). Bladder (cystocele). Rectum (rectocele). Intestines (enterocele). When organs other than the vagina are involved, they often bulge into the vagina or protrude from the vagina, depending on how severe the prolapse is. What are the causes? This condition may be caused by: Pregnancy, labor, and childbirth. Past pelvic surgery. Lower levels of the hormone estrogen due to menopause. Consistently lifting more than 50 lb (23 kg). Obesity. Long-term difficulty passing stool (chronic constipation). Long-term, or chronic, cough. Fluid buildup in the abdomen due to certain conditions. What are the signs or symptoms? Symptoms of this  condition include: Leaking a little urine (loss of bladder control) when you cough, sneeze, strain, and exercise (stress incontinence). This may be worse immediately  after childbirth. It may gradually improve over time. Feeling pressure in your pelvis or vagina. This pressure may increase when you cough or when you are passing stool. A bulge that protrudes from the opening of your vagina. Difficulty passing urine or stool. Pain in your lower back. Pain or discomfort during sex, or decreased interest in sex. Repeated bladder infections (urinary tract infections). Difficulty inserting a tampon. In some people, this condition causes no symptoms. How is this diagnosed? This condition may be diagnosed based on a vaginal and rectal exam. During the exam, you may be asked to cough and strain while you are lying down, sitting, and standing up. Your health care provider will determine if other tests are required, such as bladder function tests. How is this treated? Treatment for this condition may depend on your symptoms. Treatment may include: Lifestyle changes, such as drinking plenty of fluids and eating foods that are high in fiber. Emptying your bladder at scheduled times (bladder training therapy). This can help reduce or avoid urinary incontinence. Estrogen. This may help mild prolapse by increasing the strength and tone of pelvic floor muscles. Kegel exercises. These may help mild cases of prolapse by strengthening and tightening the muscles of the pelvic floor. A soft, flexible device that helps support the vaginal walls and keep pelvic organs in place (pessary). This is inserted into your vagina by your health care provider. Surgery. This is often the only form of treatment for severe prolapse. Follow these instructions at home: Eating and drinking Avoid drinking beverages that contain caffeine or alcohol. Increase your intake of high-fiber foods to decrease constipation and straining during bowel movements. Activity Lose weight if recommended by your health care provider. Avoid heavy lifting and straining with exercise and work. Do not hold your breath  when you perform mild to moderate lifting and exercise activities. Limit your activities as directed by your health care provider. Do Kegel exercises as directed by your health care provider. To do this: Squeeze your pelvic floor muscles tight. You should feel a tight lift in your rectal area and a tightness in your vaginal area. Keep your stomach, buttocks, and legs relaxed. Hold the muscles tight for up to 10 seconds. Then relax your muscles. Repeat this exercise 50 times a day, or as much as told by your health care provider. Continue to do this exercise for at least 4-6 weeks, or for as long as told by your health care provider. General instructions Take over-the-counter and prescription medicines only as told by your health care provider. Wear a sanitary pad or adult diapers if you have urinary incontinence. If you have a pessary, take care of it as told by your health care provider. Keep all follow-up visits. This is important. Contact a health care provider if you: Have symptoms that interfere with your daily activities or sex life. Need medicine to help with the discomfort. Notice bleeding from your vagina that is not related to your menstrual period. Have a fever. Have pain or bleeding when you urinate. Have bleeding when you pass stool. Pass urine when you have sex. Have chronic constipation. Have a pessary that falls out. Have a foul-smelling vaginal discharge. Have an unusual, low pain in your abdomen. Get help right away if you: Cannot pass urine. Summary Pelvic organ prolapse is the stretching, bulging, or dropping of pelvic organs  into an abnormal position. It happens when the muscles and tissues that surround and support pelvic structures become weak or stretched. When organs other than the vagina are involved, they often bulge into the vagina or protrude from it, depending on how severe the prolapse is. In most cases, this condition needs to be treated only if it produces  symptoms. Treatment may include lifestyle changes, estrogen, Kegel exercises, pessary insertion, or surgery. Avoid heavy lifting and straining with exercise and work. Do not hold your breath when you perform mild to moderate lifting and exercise activities. Limit your activities as directed by your health care provider. This information is not intended to replace advice given to you by your health care provider. Make sure you discuss any questions you have with your health care provider. Document Revised: 11/02/2019 Document Reviewed: 11/02/2019 Elsevier Patient Education  2024 Elsevier Inc.  EXERCISE AND DIET:  We recommended that you start or continue a regular exercise program for good health. Regular exercise means any activity that makes your heart beat faster and makes you sweat.  We recommend exercising at least 30 minutes per day at least 3 days a week, preferably 4 or 5.  We also recommend a diet low in fat and sugar.  Inactivity, poor dietary choices and obesity can cause diabetes, heart attack, stroke, and kidney damage, among others.    ALCOHOL AND SMOKING:  Women should limit their alcohol intake to no more than 7 drinks/beers/glasses of wine (combined, not each!) per week. Moderation of alcohol intake to this level decreases your risk of breast cancer and liver damage. And of course, no recreational drugs are part of a healthy lifestyle.  And absolutely no smoking or even second hand smoke. Most people know smoking can cause heart and lung diseases, but did you know it also contributes to weakening of your bones? Aging of your skin?  Yellowing of your teeth and nails?  CALCIUM AND VITAMIN D:  Adequate intake of calcium and Vitamin D are recommended.  The recommendations for exact amounts of these supplements seem to change often, but generally speaking 600 mg of calcium (either carbonate or citrate) and 800 units of Vitamin D per day seems prudent. Certain women may benefit from higher  intake of Vitamin D.  If you are among these women, your doctor will have told you during your visit.    PAP SMEARS:  Pap smears, to check for cervical cancer or precancers,  have traditionally been done yearly, although recent scientific advances have shown that most women can have pap smears less often.  However, every woman still should have a physical exam from her gynecologist every year. It will include a breast check, inspection of the vulva and vagina to check for abnormal growths or skin changes, a visual exam of the cervix, and then an exam to evaluate the size and shape of the uterus and ovaries.  And after 46 years of age, a rectal exam is indicated to check for rectal cancers. We will also provide age appropriate advice regarding health maintenance, like when you should have certain vaccines, screening for sexually transmitted diseases, bone density testing, colonoscopy, mammograms, etc.   MAMMOGRAMS:  All women over 69 years old should have a yearly mammogram. Many facilities now offer a "3D" mammogram, which may cost around $50 extra out of pocket. If possible,  we recommend you accept the option to have the 3D mammogram performed.  It both reduces the number of women who will be called  back for extra views which then turn out to be normal, and it is better than the routine mammogram at detecting truly abnormal areas.    COLONOSCOPY:  Colonoscopy to screen for colon cancer is recommended for all women at age 71.  We know, you hate the idea of the prep.  We agree, BUT, having colon cancer and not knowing it is worse!!  Colon cancer so often starts as a polyp that can be seen and removed at colonscopy, which can quite literally save your life!  And if your first colonoscopy is normal and you have no family history of colon cancer, most women don't have to have it again for 10 years.  Once every ten years, you can do something that may end up saving your life, right?  We will be happy to help you  get it scheduled when you are ready.  Be sure to check your insurance coverage so you understand how much it will cost.  It may be covered as a preventative service at no cost, but you should check your particular policy.

## 2023-09-27 ENCOUNTER — Telehealth: Payer: Self-pay | Admitting: Obstetrics and Gynecology

## 2023-09-27 DIAGNOSIS — N816 Rectocele: Secondary | ICD-10-CM

## 2023-09-27 DIAGNOSIS — N393 Stress incontinence (female) (male): Secondary | ICD-10-CM

## 2023-09-27 NOTE — Telephone Encounter (Signed)
 Opened in error.  Referral to Urogynecology made.

## 2023-09-27 NOTE — Telephone Encounter (Deleted)
 Please make referral to Dr. Frutoso Jing or Dr. Alva Jewels at [facility] for cystocele with rectocele and urinary stress incontinence.   Patient is considering surgical care.

## 2023-09-30 NOTE — Addendum Note (Signed)
 Addended by: Jorie Newness, Colonel Dears E on: 09/30/2023 08:31 PM   Modules accepted: Level of Service

## 2023-10-02 ENCOUNTER — Encounter: Payer: Self-pay | Admitting: Emergency Medicine

## 2023-10-02 ENCOUNTER — Ambulatory Visit (INDEPENDENT_AMBULATORY_CARE_PROVIDER_SITE_OTHER)

## 2023-10-02 ENCOUNTER — Ambulatory Visit: Admitting: Emergency Medicine

## 2023-10-02 ENCOUNTER — Ambulatory Visit: Attending: Emergency Medicine

## 2023-10-02 VITALS — BP 118/72 | HR 69 | Temp 99.0°F | Ht 61.5 in | Wt 170.0 lb

## 2023-10-02 DIAGNOSIS — R202 Paresthesia of skin: Secondary | ICD-10-CM | POA: Diagnosis not present

## 2023-10-02 DIAGNOSIS — M4317 Spondylolisthesis, lumbosacral region: Secondary | ICD-10-CM | POA: Diagnosis not present

## 2023-10-02 DIAGNOSIS — R002 Palpitations: Secondary | ICD-10-CM | POA: Diagnosis not present

## 2023-10-02 LAB — VITAMIN B12: Vitamin B-12: 345 pg/mL (ref 211–911)

## 2023-10-02 LAB — VITAMIN D 25 HYDROXY (VIT D DEFICIENCY, FRACTURES): VITD: 28.36 ng/mL — ABNORMAL LOW (ref 30.00–100.00)

## 2023-10-02 LAB — SEDIMENTATION RATE: Sed Rate: 10 mm/h (ref 0–20)

## 2023-10-02 NOTE — Assessment & Plan Note (Signed)
 Evaluation in 2017 of low back show the following: Grade 1-2 spondylolisthesis with L5-S1 anterolisthesis and pseudo disk herniation and foraminal narrowing. 07/12/2015 visit with Christophe Cram, MD. Lumbar brace. Ibuprofen . Possibly PT. May be contributing to present symptoms

## 2023-10-02 NOTE — Assessment & Plan Note (Signed)
 Attempted Zio patch recently but had functional/technical difficulties. Will attempt to repeat.  New order placed today. Previous assessment as follows and still remains the same: Becoming more frequent since last evaluation 1 year ago Affecting quality of life due to awareness but pretty much asymptomatic Recommend long-term monitoring with Zio patch for 14 days May need cardiology evaluation Abnormal EKG No family history of heart disease No anginal episodes No hypertension Non-smoker.  No history of diabetes

## 2023-10-02 NOTE — Progress Notes (Unsigned)
 EP to read.

## 2023-10-02 NOTE — Assessment & Plan Note (Signed)
 Clinically stable.  No red flag signs or symptoms. Unremarkable physical examination. Differential diagnosis discussed Some degenerative changes seen on x-rays done today Recommend neurology evaluation Blood work today Will need lumbar spine MRI We will follow-up after neurology evaluation

## 2023-10-02 NOTE — Progress Notes (Signed)
 Julie Boyd 48 y.o.   Chief Complaint  Patient presents with   Numbness    Patient here for numbness in the right leg, happened on both legs lately; numbness starts at the hip joint, and goes down to her feet, she gets a tingling feeling along with loss of feeling of leg, when standing for prolonged periods of time it is getting to the point where she is having to hold on to things because she can't feel her legs. Has been going on for years but has gotten worse. Also want to talk about her Zyo monitor, and the at home colon test kit that was ordered.    HISTORY OF PRESENT ILLNESS: This is a 48 y.o. female complaining of intermittent episodes of bilateral hip numbness radiating down the legs usually lasting 2 to 3 minutes. Has been getting these episodes once or twice a week but first time ever was about 8 years ago.  Not really complaining of pain but numbness and tingling to both hips and down the legs.  Denies injury.  Denies bowel or bladder symptoms.  Denies low back pain.  No other associated symptoms.  HPI   Prior to Admission medications   Medication Sig Start Date End Date Taking? Authorizing Provider  Multiple Vitamins-Minerals (MULTIVITAMIN ADULT EXTRA C PO)    Yes [provider]  valACYclovir  (VALTREX ) 1000 MG tablet Take 2 tablets (2000 mg) po bid x 24 hours. 09/26/23  Yes Greta Leatherwood, MD  phenazopyridine  (PYRIDIUM ) 100 MG tablet Take 1 tablet (100 mg total) by mouth 3 (three) times daily as needed for pain. Patient not taking: Reported on 07/15/2023 06/21/23   Thena Fireman A, NP  triamcinolone  (KENALOG ) 0.025 % ointment Apply 1 Application topically 2 (two) times daily. Use as needed. Patient not taking: Reported on 07/15/2023 06/06/22   Greta Leatherwood, MD    Allergies  Allergen Reactions   Sulfa Antibiotics Rash   Aleve [Naproxen] Rash    Pt says she can take Ibuprofen  without a problem    Patient Active Problem List    Diagnosis Date Noted   Palpitations 07/15/2023   Patellofemoral arthritis of left knee 08/24/2022   ANA positive 07/16/2022   Arthralgia of multiple joints 07/16/2022   Seasonal and perennial allergic rhinitis 12/12/2016   Pollen-food allergy syndrome, subsequent encounter 12/12/2016   Spondylolisthesis at L5-S1 level 07/14/2015   BMI 30.0-30.9,adult 05/06/2015    Past Medical History:  Diagnosis Date   Allergy    Elderly multigravida 11/29/2013   First trimester bleeding 11/29/2013   HSV-1 infection    Medical history non-contributory    Vaginal delivery 11/29/2013    Past Surgical History:  Procedure Laterality Date   Polyp removal     uterine polyp    Social History   Socioeconomic History   Marital status: Married    Spouse name: Genevie Kerns   Number of children: 3   Years of education: 12+   Highest education level: Not on file  Occupational History   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   Born in [place]. Came to the [place]  in [place], at age 18.   Social Drivers of Corporate investment banker Strain: Not on file  Food Insecurity: Not on file  Transportation Needs: Not on file  Physical Activity: Not on file  Stress: Not on file  Social Connections: Not on file  Intimate Partner Violence: Not on file    Family History  Problem Relation Age of Onset   Diabetes Father    Cancer Maternal Grandfather    Asthma Son    Allergic rhinitis Neg Hx    Angioedema Neg Hx    Eczema Neg  Hx    Immunodeficiency Neg Hx    Urticaria Neg Hx      Review of Systems  Constitutional: Negative.  Negative for chills and fever.  HENT: Negative.  Negative for congestion and sore throat.   Respiratory: Negative.  Negative for cough and shortness of breath.   Cardiovascular:  Positive for palpitations. Negative for chest pain.  Gastrointestinal:  Negative for abdominal pain, diarrhea, nausea and vomiting.  Genitourinary: Negative.  Negative for dysuria and hematuria.  Skin: Negative.  Negative for rash.  Neurological: Negative.  Negative for dizziness and headaches.  All other systems reviewed and are negative.   Vitals:   10/02/23 1025  BP: 118/72  Pulse: 69  Temp: 99 F (37.2 C)  SpO2: 97%    Physical Exam Vitals reviewed.  Constitutional:      Appearance: Normal appearance.  HENT:     Head: Normocephalic.  Eyes:     Extraocular Movements: Extraocular movements intact.     Pupils: Pupils are equal, round, and reactive to light.  Cardiovascular:     Rate and Rhythm: Normal rate and regular rhythm.     Pulses: Normal pulses.     Heart sounds: Normal heart sounds.  Pulmonary:     Effort: Pulmonary effort is normal.     Breath sounds: Normal breath sounds.  Musculoskeletal:     Cervical back: No tenderness.  Lymphadenopathy:     Cervical: No cervical adenopathy.  Skin:    General: Skin is warm and dry.     Capillary Refill: Capillary refill takes less than 2 seconds.  Neurological:     General: No focal deficit present.     Mental Status: She is alert and oriented to person, place, and time.     Sensory: No sensory deficit.     Motor: No weakness.     Coordination: Coordination normal.     Gait: Gait normal.     Deep Tendon Reflexes: Reflexes normal. Babinski sign absent on the right side. Babinski sign absent on the left side.     Reflex Scores:      Patellar reflexes are 3+ on the right side and 3+ on the left side.      Achilles reflexes are 2+ on the  right side and 2+ on the left side. Psychiatric:        Mood and Affect: Mood normal.        Behavior: Behavior normal.   DG HIPS BILAT WITH PELVIS MIN 5 VIEWS Result Date: 10/02/2023 EXAM: 5 VIEW(S) XRAY OF THE PELVIS AND BILATERAL HIP 10/02/2023 11:10:35 AM COMPARISON: None available. CLINICAL HISTORY: Bilateral hip pain. Ongoing bilateral leg numbness. FINDINGS: JOINTS: Mild degenerative changes at the bilateral inferior  sacroiliac joints and at the symphysis pubis. No significant hip arthropathy. No fracture. No hip dislocation. No pelvic diastasis. No suspicious focal osseous lesions. SOFT TISSUES: The soft tissues are unremarkable. IMPRESSION: 1. No acute abnormality of the bilateral hips. 2. Mild degenerative changes at the bilateral inferior sacroiliac joints and at the symphysis pubis. Electronically signed by: Karlyn Overman MD 10/02/2023 11:29 AM EDT RP Workstation: [id number]      ASSESSMENT & PLAN: A total of 46 minutes was spent with the patient and counseling/coordination of care regarding preparing for this visit, review of most recent office visit notes, review of multiple chronic medical conditions and their management, differential diagnosis of bilateral leg paresthesias and need for neurological workup, need for lumbar spine MRI, review of today's x-ray images, symptom management, review of all medications, review of most recent bloodwork results, review of health maintenance items, education on nutrition, prognosis, documentation, and need for follow up.   Problem List Items Addressed This Visit       Musculoskeletal and Integument   Spondylolisthesis at L5-S1 level   Evaluation in 2017 of low back show the following: Grade 1-2 spondylolisthesis with L5-S1 anterolisthesis and pseudo disk herniation and foraminal narrowing. 07/12/2015 visit with Christophe Cram, MD. Lumbar brace. Ibuprofen . Possibly PT. May be contributing to present symptoms        Other   Palpitations    Attempted Zio patch recently but had functional/technical difficulties. Will attempt to repeat.  New order placed today. Previous assessment as follows and still remains the same: Becoming more frequent since last evaluation 1 year ago Affecting quality of life due to awareness but pretty much asymptomatic Recommend long-term monitoring with Zio patch for 14 days May need cardiology evaluation Abnormal EKG No family history of heart disease No anginal episodes No hypertension Non-smoker.  No history of diabetes      Relevant Orders   LONG TERM MONITOR (3-14 DAYS)   Bilateral leg paresthesia - Primary   Clinically stable.  No red flag signs or symptoms. Unremarkable physical examination. Differential diagnosis discussed Some degenerative changes seen on x-rays done today Recommend neurology evaluation Blood work today Will need lumbar spine MRI We will follow-up after neurology evaluation      Relevant Orders   Vitamin B12   VITAMIN D  25 Hydroxy (Vit-D Deficiency, Fractures)   Sedimentation rate   ANA,IFA RA Diag Pnl w/rflx Tit/Patn   MR Lumbar Spine Wo Contrast   Ambulatory referral to Neurology   DG HIPS BILAT WITH PELVIS MIN 5 VIEWS (Completed)   Patient Instructions  Parestesia Paresthesia La parestesia es una sensacin de ardor o picazn. Esta sensacin puede aparecer en cualquier parte del cuerpo. Suele [employer], los brazos, las piernas o los pies. Por lo general, no es dolorosa. En la [employer], la sensacin desaparece al poco tiempo y no es un signo de un problema grave. Si tiene parestesia que dura mucho tiempo, es necesario que consulte a un mdico. Siga estas indicaciones en su casa: Nutricin Siga una dieta saludable. Esto puede comprender lo siguiente: [place] alimentos ricos en fibra. Entre ellos, frijoles, cereales integrales y frutas y verduras frescas. Limitar los alimentos con alto contenido de grasa y International aid/development worker. Estos incluyen  alimentos fritos o dulces.  Consumo de alcohol  No beba alcohol si: El mdico le indica que no lo haga. Est embarazada, puede estar embarazada o est tratando de quedar embarazada. Si bebe alcohol: Limite la cantidad que bebe a lo siguiente: Eddie Good  0 a 1 medida por da para las mujeres. De 0 a 2 medidas por da para los hombres. Sepa cunta cantidad de alcohol hay en las bebidas que toma. En los [address], una medida equivale a una botella de cerveza de 12 oz (355 ml), un vaso de vino de 5 oz (148 ml) o un vaso de una bebida alcohlica de alta graduacin de 1 oz (44 ml). Indicaciones generales Use los medicamentos de venta libre y los recetados solamente como se lo haya indicado el mdico. No fume ni consuma ningn producto que contenga nicotina o tabaco. Si necesita ayuda para dejar de consumir estos productos, consulte al mdico. Si tiene diabetes, colabore con el mdico para asegurarse de Futures trader de azcar en sangre dentro de un rango saludable. Si siente adormecimiento en los pies: Controle si tiene enrojecimiento, calor e hinchazn todos los [place]. Use calcetines acolchados y zapatos cmodos. Estos ayudan a [employer]. Concurra a todas las visitas de seguimiento. Comunquese con un mdico si: Sus sntomas de parestesia empeoran o no desaparecen. Pierde la sensibilidad (tiene entumecimiento) despus de una lesin. La sensacin de ardor o picazn empeora al caminar. Siente dolor o tiene calambres. Se siente mareado o se desmaya. Tiene una erupcin cutnea. Solicite ayuda de inmediato si: Se siente dbil o comienza a sentir debilidad en un brazo o una pierna. Tiene dificultad para caminar o moverse. Tiene problemas para hablar, comprender o ver. Se siente confundido. No puede controlar la orina (miccin) ni la evacuacin de la materia fecal (defecacin). Estos sntomas pueden Customer service manager. Solicite ayuda de inmediato. Llame al 911. No espere a ver si  los sntomas desaparecen. No conduzca por sus propios medios [employer]. Resumen La parestesia es una sensacin de ardor o picazn. Suele [employer], los brazos, las piernas o los pies. En la [employer], la sensacin desaparece al poco tiempo y no es un signo de un problema grave. Si tiene parestesia por mucho tiempo, es necesario que lo examine el mdico. Esta informacin no tiene Theme park manager el consejo del mdico. Asegrese de hacerle al mdico cualquier pregunta que tenga. Document Revised: 02/02/2021 Document Reviewed: 02/02/2021 Elsevier Patient Education  2024 Elsevier Inc.    Maryagnes Small, MD  Primary Care at [facility]

## 2023-10-02 NOTE — Patient Instructions (Signed)
Parestesia Paresthesia La parestesia es una sensacin de ardor o picazn. Esta sensacin puede aparecer en cualquier parte del cuerpo. Suele [employer], los brazos, las piernas o los pies. Por lo general, no es dolorosa. En la [employer], la sensacin desaparece al poco tiempo y no es un signo de un problema grave. Si tiene parestesia que dura [employer], es necesario que consulte a un mdico. Siga estas indicaciones en su casa: Nutricin Siga una dieta saludable. Esto puede comprender lo siguiente: [place] alimentos ricos en fibra. Entre ellos, frijoles, cereales integrales y frutas y verduras frescas. Limitar los alimentos con alto contenido de grasa y International aid/development worker. Estos incluyen alimentos fritos o dulces.  Consumo de alcohol  No beba alcohol si: El mdico le indica que no lo haga. Est embarazada, puede estar embarazada o est tratando de [place]. Si bebe alcohol: Limite la cantidad que bebe a lo siguiente: De 0 a 1 medida por da para las mujeres. De 0 a 2 medidas por da para los hombres. Sepa cunta cantidad de alcohol hay en las bebidas que toma. En los [address], una medida equivale a una botella de cerveza de 12 oz (355 ml), un vaso de vino de 5 oz (148 ml) o un vaso de una bebida alcohlica de alta graduacin de 1 oz (44 ml). Indicaciones generales Use los medicamentos de venta libre y los recetados solamente como se lo haya indicado el mdico. No fume ni consuma ningn producto que contenga nicotina o tabaco. Si necesita ayuda para dejar de consumir estos productos, consulte al mdico. Si tiene diabetes, colabore con el mdico para asegurarse de Futures trader de azcar en sangre dentro de un rango saludable. Si siente adormecimiento en los pies: Controle si tiene enrojecimiento, calor e hinchazn todos los [place]. Use calcetines acolchados y zapatos cmodos. Estos ayudan a [employer]. Concurra a todas las visitas de  seguimiento. Comunquese con un mdico si: Sus sntomas de parestesia empeoran o no desaparecen. Pierde la sensibilidad (tiene entumecimiento) despus de una lesin. La sensacin de ardor o picazn empeora al caminar. Siente dolor o tiene calambres. Se siente mareado o se desmaya. Tiene una erupcin cutnea. Solicite ayuda de inmediato si: Se siente dbil o comienza a sentir debilidad en un brazo o una pierna. Tiene dificultad para caminar o moverse. Tiene problemas para hablar, comprender o ver. Se siente confundido. No puede controlar la orina (miccin) ni la evacuacin de la materia fecal (defecacin). Estos sntomas pueden Customer service manager. Solicite ayuda de inmediato. Llame al 911. No espere a ver si los sntomas desaparecen. No conduzca por sus propios medios [employer]. Resumen La parestesia es una sensacin de ardor o picazn. Suele [employer], los brazos, las piernas o los pies. En la [employer], la sensacin desaparece al poco tiempo y no es un signo de un problema grave. Si tiene parestesia por mucho tiempo, es necesario que lo examine el mdico. Esta informacin no tiene Theme park manager el consejo del mdico. Asegrese de hacerle al mdico cualquier pregunta que tenga. Document Revised: 02/02/2021 Document Reviewed: 02/02/2021 Elsevier Patient Education  2024 [employer].

## 2023-10-03 ENCOUNTER — Encounter: Payer: Self-pay | Admitting: Neurology

## 2023-10-05 LAB — ANTI-NUCLEAR AB-TITER (ANA TITER): ANA Titer 1: 1:80 {titer} — ABNORMAL HIGH

## 2023-10-05 LAB — ANA,IFA RA DIAG PNL W/RFLX TIT/PATN
Anti Nuclear Antibody (ANA): POSITIVE — AB
Cyclic Citrullin Peptide Ab: <16 U
Rheumatoid fact SerPl-aCnc: <10 [IU]/mL (ref <14)

## 2023-10-08 ENCOUNTER — Encounter: Payer: Self-pay | Admitting: Emergency Medicine

## 2023-10-08 ENCOUNTER — Encounter: Payer: Self-pay | Admitting: Obstetrics and Gynecology

## 2023-10-08 ENCOUNTER — Other Ambulatory Visit

## 2023-10-08 NOTE — Telephone Encounter (Signed)
 I did agree to see Julie Boyd as a new patient.  Please inform front desk about this and schedule him accordingly.

## 2023-10-10 ENCOUNTER — Encounter: Payer: Self-pay | Admitting: Gastroenterology

## 2023-10-11 ENCOUNTER — Other Ambulatory Visit

## 2023-10-22 ENCOUNTER — Ambulatory Visit: Payer: Self-pay | Admitting: Emergency Medicine

## 2023-10-22 DIAGNOSIS — R937 Abnormal findings on diagnostic imaging of other parts of musculoskeletal system: Secondary | ICD-10-CM

## 2023-10-22 DIAGNOSIS — M4317 Spondylolisthesis, lumbosacral region: Secondary | ICD-10-CM

## 2023-10-22 DIAGNOSIS — R202 Paresthesia of skin: Secondary | ICD-10-CM

## 2023-10-22 DIAGNOSIS — E278 Other specified disorders of adrenal gland: Secondary | ICD-10-CM

## 2023-10-22 NOTE — Telephone Encounter (Signed)
 I sent her a MyChart message No concerns about any of the results No further recommendations Needs to follow-up with Dr. Lydia Sams

## 2023-10-24 ENCOUNTER — Ambulatory Visit
Admission: RE | Admit: 2023-10-24 | Discharge: 2023-10-24 | Disposition: A | Discharge status: Home / Self Care | Source: Ambulatory Visit | Attending: Emergency Medicine | Admitting: Emergency Medicine

## 2023-10-24 DIAGNOSIS — R202 Paresthesia of skin: Secondary | ICD-10-CM

## 2023-10-25 DIAGNOSIS — R937 Abnormal findings on diagnostic imaging of other parts of musculoskeletal system: Secondary | ICD-10-CM | POA: Insufficient documentation

## 2023-10-25 DIAGNOSIS — E278 Other specified disorders of adrenal gland: Secondary | ICD-10-CM | POA: Insufficient documentation

## 2023-10-29 ENCOUNTER — Telehealth: Payer: Self-pay | Admitting: Emergency Medicine

## 2023-10-29 ENCOUNTER — Other Ambulatory Visit: Payer: Self-pay | Admitting: Radiology

## 2023-10-29 ENCOUNTER — Ambulatory Visit: Admitting: Neurology

## 2023-10-29 ENCOUNTER — Encounter: Payer: Self-pay | Admitting: Neurology

## 2023-10-29 VITALS — BP 109/63 | HR 54 | Ht 61.0 in | Wt 171.2 lb

## 2023-10-29 DIAGNOSIS — M5416 Radiculopathy, lumbar region: Secondary | ICD-10-CM | POA: Diagnosis not present

## 2023-10-29 NOTE — Progress Notes (Signed)
 [facility] Clinic Note - Initial Visit   Date: 10/29/2023   Julie Boyd MRN: [id number] DOB: Dec 20, 1975   Dear Dr. Malone Sear:  Thank you for your kind referral of Julie Boyd for consultation of lumbar radiculopathy. Although her history is well known to you, please allow us  to reiterate it for the purpose of our medical record. The patient was accompanied to the clinic by self.    Julie Boyd is a 48 y.o. right-handed female presenting for evaluation of lumbar radiculopathy.   IMPRESSION/PLAN: Lumbar radiculopathy at L5 (right > left) due to spondylolisthesis of L5 on S1 due to bilateral pars defects causing biforaminal stenosis.  - Start PT for low back strengthening   Return to clinic in 4 months  ------------------------------------------------------------- History of present illness: For the past 10 years, she has episodic numbness involving the legs from the thigh down into the toes, which is worse on the right.  It is worse with prolonged standing and kneeling.  It lasts about a minute and occurs about 3 times per week, but has been getting worse lately.  When it occurs, she feels as if she can fall because of the intensity of numbness.  She has relief with repositioning.  She denies weakness in the legs.   She denies back pain, but does have neck pain.    She lives at home with husband and three sons.    Out-side paper records, electronic medical record, and images have been reviewed where available and summarized as:  MRI lumbar spine wo contrast 10/25/2023: Grade 1-2 anterior spondylolisthesis of L5 on S1 secondary to bilateral pars defects resulting in severe bilateral foraminal narrowing. Correlation for L5 radiculopathy as described in detail above. No significant spinal stenosis.   Indeterminate left adrenal nodule as above.  Lab Results  Component Value Date   HGBA1C 5.1 07/15/2023   Lab Results   Component Value Date   VITAMINB12 345 10/02/2023   Lab Results  Component Value Date   TSH 4.62 07/15/2023   Lab Results  Component Value Date   ESRSEDRATE 10 10/02/2023    Past Medical History:  Diagnosis Date   Allergy    Elderly multigravida 11/29/2013   First trimester bleeding 11/29/2013   HSV-1 infection    Medical history non-contributory    Vaginal delivery 11/29/2013    Past Surgical History:  Procedure Laterality Date   Polyp removal     uterine polyp     Medications:  Outpatient Encounter Medications as of 10/29/2023  Medication Sig   FIBER PO Take by mouth.   Multiple Vitamins-Minerals (MULTIVITAMIN ADULT EXTRA C PO)    valACYclovir  (VALTREX ) 1000 MG tablet Take 2 tablets (2000 mg) po bid x 24 hours.   [DISCONTINUED] phenazopyridine  (PYRIDIUM ) 100 MG tablet Take 1 tablet (100 mg total) by mouth 3 (three) times daily as needed for pain. (Patient not taking: Reported on 07/15/2023)   [DISCONTINUED] triamcinolone  (KENALOG ) 0.025 % ointment Apply 1 Application topically 2 (two) times daily. Use as needed. (Patient not taking: Reported on 07/15/2023)   No facility-administered encounter medications on file as of 10/29/2023.    Allergies:  Allergies  Allergen Reactions   Sulfa Antibiotics Rash   Aleve [Naproxen] Rash    Pt says she can take Ibuprofen  without a problem    Family History: Family History  Problem Relation Age of Onset   Diabetes Father    Cancer Maternal Grandfather    Asthma Son    Allergic rhinitis Neg Hx  Angioedema Neg Hx    Eczema Neg Hx    Immunodeficiency Neg Hx    Urticaria Neg Hx     Social History: Social History   Tobacco Use   Smoking status: Never    Passive exposure: Current   Smokeless tobacco: Never  Vaping Use   Vaping status: Never Used  Substance Use Topics   Alcohol use: No    Alcohol/week: 0.0 standard drinks of alcohol   Drug use: No   Social History   Social History Narrative   Lives with her  long-time boyfriend and their 3 sons.   Born in [place]. Came to the [place]  in [place], at age 20.   Right    Working yes does yard work,     Vital Signs:  BP 109/63   Pulse (!) 54   Ht 5\' 1"  (1.549 m)   Wt 171 lb 3.2 oz (77.7 kg)   SpO2 99%   BMI 32.35 kg/m    Neurological Exam: MENTAL STATUS including orientation to time, place, person, recent and remote memory, attention span and concentration, language, and fund of knowledge is normal.  Speech is not dysarthric.  CRANIAL NERVES: II:  No visual field defects.     III-IV-VI: Pupils equal round and reactive to light.  Normal conjugate, extra-ocular eye movements in all directions of gaze.  No nystagmus.  No ptosis.   V:  Normal facial sensation.    VII:  Normal facial symmetry and movements.   VIII:  Normal hearing and vestibular function.   IX-X:  Normal palatal movement.   XI:  Normal shoulder shrug and head rotation.   XII:  Normal tongue strength and range of motion, no deviation or fasciculation.  MOTOR:  No atrophy, fasciculations or abnormal movements.  No pronator drift.   Upper Extremity:  Right  Left  Deltoid  5/5   5/5   Biceps  5/5   5/5   Triceps  5/5   5/5   Wrist extensors  5/5   5/5   Wrist flexors  5/5   5/5   Finger extensors  5/5   5/5   Finger flexors  5/5   5/5   Dorsal interossei  5/5   5/5   Abductor pollicis  5/5   5/5   Tone (Ashworth scale)  0  0   Lower Extremity:  Right  Left  Hip flexors  5/5   5/5   Knee flexors  5/5   5/5   Knee extensors  5/5   5/5   Dorsiflexors  5/5   5/5   Plantarflexors  5/5   5/5   Toe extensors  5/5   5/5   Toe flexors  5/5   5/5   Tone (Ashworth scale)  0  0   MSRs:                                           Right        Left brachioradialis 2+  2+  biceps 2+  2+  triceps 2+  2+  patellar 2+  2+  ankle jerk 2+  2+  plantar response down  down   SENSORY:  Normal and symmetric perception of light touch, pinprick, vibration, and temperature.    COORDINATION/GAIT: Normal finger-to- nose-finger.  Intact rapid alternating movements bilaterally.  Gait narrow based and stable. Tandem and  stressed gait intact.     Thank you for allowing me to participate in patient's care.  If I can answer any additional questions, I would be pleased to do so.    Sincerely,    Sujay Grundman K. Lydia Sams, DO

## 2023-10-29 NOTE — Telephone Encounter (Signed)
 Copied from CRM [id number]. Topic: General - Other >> Oct 29, 2023 10:08 AM Kita Perish H wrote: Reason for CRM: [facility] L[facility] calling due to needing order revised for patient, received order for CT adrenal abdomen w/out contrast and need order to be CT adrenal with and w/out contrast. Need order changed before they can schedule patient.  [facility] L[facility] [phone] Option #1 then Option #4

## 2023-10-29 NOTE — Patient Instructions (Signed)
 We will refer you to physical therapy.  They will contact you to schedule appointment.

## 2023-10-30 ENCOUNTER — Telehealth: Payer: Self-pay

## 2023-10-30 ENCOUNTER — Ambulatory Visit (AMBULATORY_SURGERY_CENTER)

## 2023-10-30 ENCOUNTER — Other Ambulatory Visit: Payer: Self-pay | Admitting: Radiology

## 2023-10-30 VITALS — Ht 61.5 in | Wt 171.0 lb

## 2023-10-30 DIAGNOSIS — Z1211 Encounter for screening for malignant neoplasm of colon: Secondary | ICD-10-CM

## 2023-10-30 MED ORDER — NA SULFATE-K SULFATE-MG SULF 17.5-3.13-1.6 GM/177ML PO SOLN: 1.0000 | Freq: Once | ORAL | 0 refills | Status: AC

## 2023-10-30 NOTE — Telephone Encounter (Signed)
 error

## 2023-10-30 NOTE — Progress Notes (Signed)
 Pre visit completed via phone call; Patient verified name, DOB, and address; No egg or soy allergy known to patient;  No issues known to pt with past sedation with any surgeries or procedures; Patient denies ever being told they had issues or difficulty with intubation; No FH of Malignant Hyperthermia; Pt is not on diet pills; Pt is not on home 02;  Pt is not on blood thinners;  Pt reports issues with constipation- when taking fiber daily has regular bowel movements; RN advised patient she will need to use Miralax x 5 days prior to her prep starting;  also advised patient to increase her oral fluids/activity/fruits/veggies that are allowed;  No A fib or A flutter; Have any cardiac testing pending--NO Insurance verified during PV appt--- Medicaid Pt can ambulate without assistance;  Pt denies use of chewing tobacco; Discussed diabetic/weight loss medication holds; Discussed NSAID holds; Checked BMI to be less than 50; Pt instructed to use Singlecare.com or GoodRx for a price reduction on prep;  Patient's chart reviewed by Rogena Class CNRA prior to previsit and patient appropriate for the [facility]; Pre visit completed and red dot placed by patient's name on their procedure day (on provider's schedule);  Instructions sent to MyChart per patient request;

## 2023-10-30 NOTE — Telephone Encounter (Signed)
 Vitamin D  level came back low.  Recommend daily vitamin D  supplementation with highest dose available over-the-counter. Positive ANA was already addressed by her rheumatologist.  No concerns there. No other significant abnormality that we will need to worry about.

## 2023-10-30 NOTE — Telephone Encounter (Signed)
 No need for concerns at this time.  Just needs to follow the findings and follow-up as recommended.

## 2023-10-31 ENCOUNTER — Encounter: Payer: Self-pay | Admitting: Emergency Medicine

## 2023-10-31 ENCOUNTER — Other Ambulatory Visit: Payer: Self-pay | Admitting: Radiology

## 2023-10-31 DIAGNOSIS — E278 Other specified disorders of adrenal gland: Secondary | ICD-10-CM

## 2023-10-31 NOTE — Telephone Encounter (Signed)
 New  CT order placed

## 2023-11-04 ENCOUNTER — Ambulatory Visit: Attending: Neurology | Admitting: Physical Therapy

## 2023-11-04 ENCOUNTER — Encounter: Payer: Self-pay | Admitting: Physical Therapy

## 2023-11-04 ENCOUNTER — Other Ambulatory Visit: Payer: Self-pay

## 2023-11-04 DIAGNOSIS — M5416 Radiculopathy, lumbar region: Secondary | ICD-10-CM | POA: Diagnosis not present

## 2023-11-04 DIAGNOSIS — R293 Abnormal posture: Secondary | ICD-10-CM | POA: Insufficient documentation

## 2023-11-04 DIAGNOSIS — M6281 Muscle weakness (generalized): Secondary | ICD-10-CM | POA: Insufficient documentation

## 2023-11-04 NOTE — Therapy (Signed)
 OUTPATIENT PHYSICAL THERAPY NEURO EVALUATION   Patient Name: Julie Boyd MRN: [id number] DOB:12-02-1975, 48 y.o., female Today's Date: 11/04/2023   PCP: Elvira Hammersmith, MD REFERRING PROVIDER: Daryel Ensign, DO   END OF SESSION:  PT End of Session - 11/04/23 0951     Visit Number 1    Number of Visits 12    Date for PT Re-Evaluation 12/13/23    Authorization Type [place] [employer]    Authorization Time Period Auth required after 27 visits    PT Start Time 0803    PT Stop Time 0846    PT Time Calculation (min) 43 min    Activity Tolerance Patient tolerated treatment well    Behavior During Therapy [facility] for tasks assessed/performed          Past Medical History:  Diagnosis Date   Allergy    Elderly multigravida 11/29/2013   First trimester bleeding 11/29/2013   HSV-1 infection    Medical history non-contributory    Vaginal delivery 11/29/2013   Past Surgical History:  Procedure Laterality Date   Polyp removal     uterine polyp   Patient Active Problem List   Diagnosis Date Noted   Adrenal mass (HCC) 10/25/2023   Abnormal MRI, lumbar spine 10/25/2023   Bilateral leg paresthesia 10/02/2023   Palpitations 07/15/2023   Patellofemoral arthritis of left knee 08/24/2022   ANA positive 07/16/2022   Arthralgia of multiple joints 07/16/2022   Seasonal and perennial allergic rhinitis 12/12/2016   Pollen-food allergy syndrome, subsequent encounter 12/12/2016   Spondylolisthesis at L5-S1 level 07/14/2015   BMI 30.0-30.9,adult 05/06/2015    ONSET DATE: 10/29/2023 (MD referral)  REFERRING DIAG: M54.16 (ICD-10-CM) - Lumbar radiculopathy   THERAPY DIAG:  Radiculopathy, lumbar region  Abnormal posture  Muscle weakness (generalized)  Rationale for Evaluation and Treatment: Rehabilitation  SUBJECTIVE:                                                                                                                                                                                              SUBJECTIVE STATEMENT: It's been about 10 years that I will stand up and feel numbness through the full RLE, from hip through the lower leg.  It seemed like I would fall if I tried to move.  This happens more and more over the years.  This has recently been happening in my LLE as well (one time about a month ago).  Doesn't always happen, but it's scary.  Had MRI in 2016 and then recently.   Pt accompanied by: self  PERTINENT HISTORY: Lumbar radiculopathy at L5 (right > left) due to  spondylolisthesis of L5 on S1 due to bilateral pars defects causing biforaminal stenosis.   PAIN:  Are you having pain? No  PRECAUTIONS: Fall  RED FLAGS: None   WEIGHT BEARING RESTRICTIONS: No  FALLS: Has patient fallen in last 6 months? 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  OBJECTIVE:  Note: Objective measures were completed at Evaluation unless otherwise noted.  DIAGNOSTIC FINDINGS: MRI lumbar spine wo contrast 10/25/2023: Grade 1-2 anterior spondylolisthesis of L5 on S1 secondary to bilateral pars defects resulting in severe bilateral foraminal narrowing. Correlation for L5 radiculopathy as described in detail above. No significant spinal stenosis.  COGNITION: Overall cognitive status: Within functional limits for tasks assessed   SENSATION: Light touch: WFL  MUSCLE LENGTH: Hamstrings: Right -15 deg (pt reports brief numbness in ant hip getting into stretch position); Left -15 deg SLR Test:  negative L, negative R Thomas Test:  20 degrees L  POSTURE: rounded shoulders, increased lumbar lordosis, and anterior pelvic  tilt    LOWER EXTREMITY ROM:   Active ROM WFL  LOWER EXTREMITY MMT:    MMT Right Eval Left Eval  Hip flexion 4+ 4+  Hip extension    Hip abduction 5 5  Hip adduction 5 5  Hip internal rotation    Hip external rotation    Knee flexion 5 5  Knee extension 5 5  Ankle dorsiflexion 4+ 4+  Ankle plantarflexion    Ankle inversion 4 4  Ankle eversion 4+ 4+  (Blank rows = not tested)   TRANSFERS: Sit to stand: Complete Independence  Assistive device utilized: None     Stand to sit: Complete Independence  Assistive device utilized: None      GAIT: Findings: Gait Characteristics: step through pattern, Distance walked: clinic distances, Assistive device utilized:None, and Level of assistance: Complete Independence  FUNCTIONAL TESTS:  Repeated flexion:  No initial pain/symptoms; no symptoms upon completion Repeated extension:  No initial symptoms, no symptoms upon completion  PATIENT SURVEYS:  Modified Oswestry 1/50 (Pt does not c/o pain, mostly numbness)                                                                                                                               TREATMENT DATE: 11/04/2023    PATIENT EDUCATION: Education details: Initial eval, POC; initial HEP and education in lumbar spine neutral position with abdominal activation Person educated: Patient Education method: Explanation, Demonstration, and Handouts Education comprehension: verbalized understanding and returned demonstration  HOME EXERCISE PROGRAM: Access Code: [email or web address] URL: [email or web address] Date: 11/04/2023 Prepared by: [facility] - Outpatient  Rehab - [facility]  Exercises - Seated Pelvic Tilt  - 1 x daily - 7 x  weekly - 3 reps - Tall Kneeling Posterior Pelvic Tilt  - 1 x daily - 7 x weekly - 1 sets - 3 reps - Standing Lumbar Spine Flexion Stretch Counter  - 1 x daily - 7 x weekly - 1 sets - 3 reps - 15-30 sec hold  GOALS: Goals reviewed with patient? Yes  SHORT  TERM GOALS: Target date: 11/21/2023  Pt will be independent with HEP for improved posture, strength. Baseline: Goal status: INITIAL   LONG TERM GOALS: Target date: 12/13/2023  Pt will be independent with HEP for improved posture, strength. Baseline:  Goal status: INITIAL  2.  Pt will report at least 50% decrease in episodes of RLE numbness with standing and gait. Baseline:  Goal status: INITIAL  3.  6 MWT to be assessed and distance to improve by 100 ft, with no increase in RLE numbness. Baseline: TBA Goal status: INITIAL  4.  Pt will verbalize understanding of posture, body mechanics, lifting, for decreasing numbness/lumbar symptoms. Baseline:  Goal status: INITIAL   ASSESSMENT:  CLINICAL IMPRESSION: Patient is a 48 y.o. female who was seen today for physical therapy evaluation and treatment for lumbar radiculopathy.  She denies pain, but she does report RLE numbness at times after prolonged standing or walking; she has has one episode on LLE as well.  MRI shows spondylolisthesis L5-SI level.  She feels at times the the RLE numbness may cause a fall, but she has not had any falls at this time.  She presents with increased lumbar lordosis, increased anterior pelvic tilt, slight decrease in hip flexor and ankle strength.  She will benefit from skilled PT to address the above deficits, to educate in posture, body mechanics, low back and core/abdominal strength to lessen radicular symptoms and to improve overall functional mobility.   OBJECTIVE IMPAIRMENTS: difficulty walking, postural dysfunction, pain, and radicular symptoms/numbness RLE>LLE.   ACTIVITY LIMITATIONS: lifting, standing, locomotion level, and kneeling  PARTICIPATION LIMITATIONS: community activity, occupation, yard work, and church  PERSONAL FACTORS: 1 comorbidity: hx of spondylolisthesis are also affecting patient's functional outcome.   REHAB POTENTIAL: Good  CLINICAL DECISION MAKING:  Stable/uncomplicated  EVALUATION COMPLEXITY: Low  PLAN:  PT FREQUENCY: 1-2x/week  PT DURATION: 6 weeks including eval week  PLANNED INTERVENTIONS: 97164- PT Re-evaluation, 97750- Physical Performance Testing, 97110-Therapeutic exercises, 97530- Therapeutic activity, 97112- Neuromuscular re-education, 97535- Self Care, 16109- Manual therapy, Patient/Family education, Balance training, and Joint mobilization  PLAN FOR NEXT SESSION: Assess 6 MWT; core stabilization/lumbar exercises-try quadruped/bridging; education in body mechanics and posture for her job   Dessie Flow W., PT 11/04/2023, 9:56 AM  [facility] at [facility] [address], [address] [place], [place] [address] Phone # [phone] Fax # [phone]

## 2023-11-05 NOTE — Progress Notes (Signed)
 Office Visit Note  Patient: Julie Boyd             Date of Birth: 12-Jun-1975           MRN: [id number]             PCP: Purcell Emil Schanz, MD Referring: Purcell Emil Schanz, * Visit Date: 11/19/2023   Subjective:  Follow-up (Patient states she was here twice last year and her lab work was normal. Patient states her Doctor ordered the same tests again this year and they are abnormal again. )    Discussed the use of AI scribe software for clinical note transcription with the patient, who gave verbal consent to proceed.  History of Present Illness   Julie Boyd is a 48 y.o. female here for follow up for follow up for polyarticular joint pain with positive ANA.  We previously saw her for the same problem last year with a benign exam.  Her labs had shown a high CRP at the initial visit which appeared to be related to the shortly preceding oral surgery and normalized on repeat testing.  She experiences numbness and tingling from the front of her leg down to her foot, particularly when standing. This sensation has been present for over ten years, primarily affecting the right leg, but recently also involving the left leg. The numbness is severe enough at times to make her feel as though she might fall, necessitating rubbing or massaging her leg to regain sensation.  Imaging studies, including x-rays and an MRI, revealed issues in her lower back with L5-S1 spondylosis. The MRI also identified a left adrenal nodule, and a subsequent CT scan suggested the presence of nodules on both sides.  She has been experiencing irregular menstrual cycles since May. She has also been struggling with weight management despite maintaining a high level of physical activity.  She is currently undergoing physical therapy twice a week for her back issues, focusing on learning exercises to strengthen her core and address her symptoms. She reports no swelling in her joints  currently.   Previous HPI 08/24/2022 Julie Boyd is a 48 y.o. female here for follow up for polyarticular joint pain with positive ANA.  Workup at initial visit demonstrated some left knee osteoarthritis specially with patellofemoral and patellar tendon changes.  Specific antibodies were all negative but CRP was highly elevated at 40.6.  On review she does recall had an oral surgery for gingival recession shortly before our initial clinic visit and wonders if this could be related with her abnormal test result.  About 1 week ago she suddenly developed pain in the left elbow she did not recall any specific injury or event that.  She has been doing some pulling or lifting but really started noticing the pain just while taking a shower.  Localizes to the medial aspect of the elbow and notices pain with pronation movement.  She discussed this with a doctor friend of hers who thinks this is the medial epicondylitis and questions whether or not it is related to her workup for inflammatory arthritis.     Previous HPI 08/08/22 Julie Boyd is a 48 y.o. female her for evaluation of positive ANA associated with joint pains.  The particular concern is due to some increased pain and stiffness in her hands and difficulty tightly gripping items and sometimes dropping items that started late last year.  She sees some occasional swelling in her hands but not every day.  No erythema or warmth.  Does not notice numbness and no radiation of symptoms.  She has not noticed a big difference of morning versus later in the day.  Pain is mostly just present with use.  She has tried taking ibuprofen  400 mg but did not see a very large difference in the problem.  She does not recall any new illness or injury prior to onset of symptoms.  She has had some joint problems in the past with patellofemoral crepitus for years and had x-rays with Dr. Josefina about 5 years ago with no particular intervention recommended.  She  does not get pain or swelling but notices this grinding sound and sensation in her knees especially with exercises such as squats or leg press or climbing stairs.  Also gets trouble with low back she experiences numbness going down her entire right leg if she stands in a stationary position for more than a few minutes at a time.  This is alleviated by walking and shifting position does not occur while seated or lying down.  She had previous evaluation with some L5-S1 anterolisthesis with pseudodisc herniation and foraminal narrowing from 2017 imaging.   Labs reviewed ANA 1:80 homogenous ESR 5   Review of Systems  Constitutional:  Negative for fatigue.  HENT:  Negative for mouth sores and mouth dryness.   Eyes:  Negative for dryness.  Respiratory:  Negative for shortness of breath.   Cardiovascular:  Positive for palpitations. Negative for chest pain.  Gastrointestinal:  Positive for constipation. Negative for blood in stool and diarrhea.  Endocrine: Negative for increased urination.  Genitourinary:  Positive for involuntary urination.  Musculoskeletal:  Positive for morning stiffness. Negative for joint pain, gait problem, joint pain, joint swelling, myalgias, muscle weakness, muscle tenderness and myalgias.  Skin:  Negative for color change, rash, hair loss and sensitivity to sunlight.  Allergic/Immunologic: Negative for susceptible to infections.  Neurological:  Negative for dizziness and headaches.  Hematological:  Negative for swollen glands.  Psychiatric/Behavioral:  Negative for depressed mood and sleep disturbance. The patient is not nervous/anxious.     PMFS History:  Patient Active Problem List   Diagnosis Date Noted   Adrenal mass (HCC) 10/25/2023   Abnormal MRI, lumbar spine 10/25/2023   Bilateral leg paresthesia 10/02/2023   Palpitations 07/15/2023   Patellofemoral arthritis of left knee 08/24/2022   ANA positive 07/16/2022   Arthralgia of multiple joints 07/16/2022    Seasonal and perennial allergic rhinitis 12/12/2016   Pollen-food allergy syndrome, subsequent encounter 12/12/2016   Spondylolisthesis at L5-S1 level 07/14/2015   BMI 30.0-30.9,adult 05/06/2015    Past Medical History:  Diagnosis Date   Allergy    Elderly multigravida 11/29/2013   First trimester bleeding 11/29/2013   HSV-1 infection    Medical history non-contributory    Vaginal delivery 11/29/2013    Family History  Problem Relation Age of Onset   Diabetes Father    Cancer Maternal Grandfather    Asthma Son    Allergic rhinitis Neg Hx    Angioedema Neg Hx    Eczema Neg Hx    Immunodeficiency Neg Hx    Urticaria Neg Hx    Colon polyps Neg Hx    Colon cancer Neg Hx    Esophageal cancer Neg Hx    Rectal cancer Neg Hx    Stomach cancer Neg Hx    Past Surgical History:  Procedure Laterality Date   Polyp removal     uterine polyp   Social History   Social History Narrative  Lives with her long-time boyfriend and their 3 sons.   Born in [place]. Came to the [place]  in [place], at age 16.   Right    Working yes does yard work,    Immunization History  Administered Date(s) Administered   Influenza Split 05/25/2013   Influenza,inj,Quad PF,6+ Mos 05/05/2015, 03/15/2017, 05/20/2018   Influenza-Unspecified 05/25/2013   Tdap 09/17/2013     Objective: Vital Signs: BP 93/65 (BP Location: Left Arm, Patient Position: Sitting, Cuff Size: Normal)   Pulse (!) 52   Resp 14   Ht 5' 1 (1.549 m)   Wt 180 lb (81.6 kg)   LMP 10/11/2023 (Exact Date) Comment: NEG BETA HCG 11/13/23  BMI 34.01 kg/m    Physical Exam Constitutional:      Appearance: She is obese.   Eyes:     Conjunctiva/sclera: Conjunctivae normal.    Cardiovascular:     Rate and Rhythm: Normal rate and regular rhythm.  Pulmonary:     Effort: Pulmonary effort is normal.     Breath sounds: Normal breath sounds.   Musculoskeletal:     Right lower leg: No edema.     Left lower leg: No edema.  Lymphadenopathy:      Cervical: No cervical adenopathy.   Skin:    General: Skin is warm and dry.     Findings: No rash.   Neurological:     Mental Status: She is alert.   Psychiatric:        Mood and Affect: Mood normal.      Musculoskeletal Exam:  Shoulders full ROM no tenderness or swelling Elbows full ROM no tenderness or swelling Wrists full ROM no tenderness or swelling Fingers full ROM no tenderness or swelling Hip normal internal and external rotation without pain, no tenderness to lateral hip palpation Knees full ROM no tenderness or swelling, patellofemoral crepitus present    Investigation: No additional findings.  Imaging: CT ABDOMEN W WO CONTRAST Result Date: 11/17/2023 CLINICAL DATA:  Follow-up adrenal mass. EXAM: CT ABDOMEN WITHOUT AND WITH CONTRAST TECHNIQUE: Multidetector CT imaging of the abdomen was performed following the standard protocol before and following the bolus administration of intravenous contrast. RADIATION DOSE REDUCTION: This exam was performed according to the departmental dose-optimization program which includes automated exposure control, adjustment of the mA and/or kV according to patient size and/or use of iterative reconstruction technique. CONTRAST:  100mL ISOVUE-300 IOPAMIDOL (ISOVUE-300) INJECTION 61% COMPARISON:  None Available. FINDINGS: Lower chest: No acute abnormality. Hepatobiliary: Ill-defined hypodensity in the anterior aspect of segment IV near the falciform ligament not well seen on noncontrast examination and equilibrate to background liver on delayed examination most commonly reflects differential perfusion or less likely focal fatty infiltration. Gallbladder is unremarkable. No biliary ductal dilation. Pancreas: No pancreatic ductal dilation or evidence of acute inflammation. Spleen: No splenomegaly or focal splenic lesion. Adrenals/Urinary Tract: 16 mm left adrenal nodule on image 58/7 measures Hounsfield units of -2 pre contrast administration, 56  postcontrast administration and 17 on delays. Question of a small nodule along the inferior limb of the right adrenal gland measuring 9 mm on sagittal image 76/11 difficult to differentiate from adjacent structures, on noncontrast enhanced sequence this area measures Hounsfield units of 12 postcontrast administration 83 and on delay Hounsfield units of 41. No hydronephrosis.  No suspicious renal mass. Stomach/Bowel: Wall thickening versus under distension of the gastric fundus/cardia. Duodenal diverticulum. Colonic stool burden suggestive of constipation. Vascular/Lymphatic: Retroaortic left renal vein. Normal caliber abdominal aorta. No pathologically enlarged abdominal lymph  nodes. Other: No significant abdominal free fluid. Musculoskeletal: No aggressive lytic or blastic lesion of bone. IMPRESSION: 1. 16 mm left adrenal nodule with imaging characteristics compatible with a benign adenoma. 2. Question of a small 9 mm nodule along the inferior limb of the right adrenal gland difficult to differentiate from adjacent structures, but also demonstrates imaging characteristics compatible with a benign adenoma. 3. Wall thickening versus under distension of the gastric fundus/cardia. Correlate for symptoms of gastritis. 4. Colonic stool burden suggestive of constipation. Electronically Signed   By: Reyes Holder M.D.   On: 11/17/2023 09:34   MR Lumbar Spine Wo Contrast Result Date: 10/25/2023 MR LUMBAR SPINE WITHOUT IV CONTRAST COMPARISON: None available CLINICAL HISTORY: Lumbar radiculopathy. TECHNIQUE: SAG T2, SAG T1, SAG STIR, AX T2, AX T1 without IV contrast. FINDINGS: There is grade 1-2 anterior spondylolisthesis of L5 on S1 secondary to bilateral pars There is no vertebral body height loss, subluxation or marrow replacing process. The sacrum and SI joints are unremarkable so far as visualized. Conus and cauda equina are unremarkable. T12-L1: There is no focal disc protrusion, foraminal or spinal stenosis. L1-2:  There is no focal disc protrusion, foraminal or spinal stenosis. L2-3: There is no focal disc protrusion, foraminal or spinal stenosis. L3-4: There is mild broad-based disc osteophyte and facet arthrosis. No significant foraminal or spinal stenosis. L4-5: Mild broad-based bulge and moderate facet arthrosis. No significant foraminal or spinal stenosis. L5-S1: Grade 1-2 anterior spondylolisthesis secondary to bilateral pars defects. This results in severe bilateral foraminal narrowing impinging the exiting L5 nerves. Correlation for L5 radiculopathy. No spinal stenosis is present. There is an incompletely evaluated left adrenal nodule measuring approximately 1.5 x 1.8 cm in size. Statistically this is a benign adenoma. Correlation with remote CTs available. Nonenhanced CT of the adrenal glands needed. IMPRESSION: Grade 1-2 anterior spondylolisthesis of L5 on S1 secondary to bilateral pars defects resulting in severe bilateral foraminal narrowing. Correlation for L5 radiculopathy as described in detail above. No significant spinal stenosis. Indeterminate left adrenal nodule as above. Electronically signed by: Norleen Satchel MD 10/25/2023 02:49 PM EDT RP Workstation: [id number]    Recent Labs: Lab Results  Component Value Date   WBC 6.6 07/15/2023   HGB 13.8 07/15/2023   PLT 193.0 07/15/2023   NA 140 07/15/2023   K 4.2 07/15/2023   CL 103 07/15/2023   CO2 26 07/15/2023   GLUCOSE 86 07/15/2023   BUN 10 07/15/2023   CREATININE 0.63 07/15/2023   BILITOT 0.6 07/15/2023   ALKPHOS 44 07/15/2023   AST 11 07/15/2023   ALT 14 07/15/2023   PROT 7.2 07/15/2023   ALBUMIN 4.5 07/15/2023   CALCIUM 9.4 07/15/2023   GFRAA 133 05/20/2018    Speciality Comments: No specialty comments available.  Procedures:  No procedures performed Allergies: Sulfa antibiotics and Aleve [naproxen]   Assessment / Plan:     Visit Diagnoses: ANA positive - Plan: Sedimentation rate, C-reactive protein, Anti-DNA antibody,  double-stranded, C3 and C4, Anti-Smith antibody, Sjogrens syndrome-A extractable nuclear antibody Persistent positive ANA at a low titer 1: 80 was rechecked this year but still did not see any highly specific clinical criteria on exam or history at this time.  Discussed low back pain would be unusual symptom and has no evidence of inflammatory changes from low back imaging.  I am not aware of association between adrenal adenoma and the positive ANA.  No peripheral joint inflammation or skin rashes present on exam. - Rechecking the sed rate CRP complements and  limited antibody panel including double-stranded DNA, Smith, and SSA - Discussed use of NSAIDs as needed as she is currently on minimal medication for arthritis  Spinal stenosis, lumbar region Chronic lumbar spinal stenosis with severe foraminal stenosis at L5-S1, causing numbness and tingling, especially when standing. MRI shows severe stenosis without spinal cord compression. - Continue physical therapy once a week for four weeks.  Osteoarthritis Osteoarthritis in hips and pelvis without inflammatory damage. Symptoms may be exacerbated by perimenopausal changes in some cases, mentioned due to new irregular menstrual cycle.  Adrenal gland adenomas Bilateral adrenal gland adenomas on MRI, likely benign. No symptoms of pheochromocytoma or hormone-secreting tumors.    Orders: Orders Placed This Encounter  Procedures   Sedimentation rate   C-reactive protein   Anti-DNA antibody, double-stranded   C3 and C4   Anti-Smith antibody   Sjogrens syndrome-A extractable nuclear antibody   No orders of the defined types were placed in this encounter.    Follow-Up Instructions: No follow-ups on file.   Lonni LELON Ester, MD  Note - This record has been created using [employer].  Chart creation errors have been sought, but may not always  have been located. Such creation errors do not reflect on  the standard of medical care.

## 2023-11-06 ENCOUNTER — Other Ambulatory Visit

## 2023-11-08 ENCOUNTER — Ambulatory Visit

## 2023-11-08 DIAGNOSIS — R293 Abnormal posture: Secondary | ICD-10-CM

## 2023-11-08 DIAGNOSIS — M6281 Muscle weakness (generalized): Secondary | ICD-10-CM | POA: Diagnosis not present

## 2023-11-08 DIAGNOSIS — M5416 Radiculopathy, lumbar region: Secondary | ICD-10-CM

## 2023-11-08 NOTE — Therapy (Signed)
 OUTPATIENT PHYSICAL THERAPY NEURO TREATMENT   Patient Name: Julie Boyd MRN: [id number] DOB:[birth year], 48 y.o., female Today's Date: 11/08/2023   PCP: Elvira Hammersmith, MD REFERRING PROVIDER: Daryel Ensign, DO   END OF SESSION:  PT End of Session - 11/08/23 0800     Visit Number 2    Number of Visits 12    Date for PT Re-Evaluation 12/13/23    Authorization Type [place] Medicaid-[employer]    Authorization Time Period Auth required after 27 visits    PT Start Time 0800    PT Stop Time 0845    PT Time Calculation (min) 45 min    Activity Tolerance Patient tolerated treatment well    Behavior During Therapy [facility] for tasks assessed/performed          Past Medical History:  Diagnosis Date   Allergy    Elderly multigravida 11/29/2013   First trimester bleeding 11/29/2013   HSV-1 infection    Medical history non-contributory    Vaginal delivery 11/29/2013   Past Surgical History:  Procedure Laterality Date   Polyp removal     uterine polyp   Patient Active Problem List   Diagnosis Date Noted   Adrenal mass (HCC) 10/25/2023   Abnormal MRI, lumbar spine 10/25/2023   Bilateral leg paresthesia 10/02/2023   Palpitations 07/15/2023   Patellofemoral arthritis of left knee 08/24/2022   ANA positive 07/16/2022   Arthralgia of multiple joints 07/16/2022   Seasonal and perennial allergic rhinitis 12/12/2016   Pollen-food allergy syndrome, subsequent encounter 12/12/2016   Spondylolisthesis at L5-S1 level 07/14/2015   BMI 30.0-30.9,adult 05/06/2015    ONSET DATE: 10/29/2023 (MD referral)  REFERRING DIAG: M54.16 (ICD-10-CM) - Lumbar radiculopathy   THERAPY DIAG:  Radiculopathy, lumbar region  Abnormal posture  Muscle weakness (generalized)  Rationale for Evaluation and Treatment: Rehabilitation  SUBJECTIVE:                                                                                                                                                                                              SUBJECTIVE STATEMENT: It's been about 10 years that I will stand up and feel numbness through the full RLE, from hip through the lower leg.  It seemed like I would fall if I tried to move.  This happens more and more over the years.  This has recently been happening in my LLE as well (one time about a month ago).  Doesn't always happen, but it's scary.  Had MRI in 2016 and then recently.   Pt accompanied by: self  PERTINENT HISTORY: Lumbar radiculopathy at L5 (right > left) due to  spondylolisthesis of L5 on S1 due to bilateral pars defects causing biforaminal stenosis.   PAIN:  Are you having pain? No  PRECAUTIONS: Fall  RED FLAGS: None   WEIGHT BEARING RESTRICTIONS: No  FALLS: Has patient fallen in last 6 months? 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  OBJECTIVE:   TODAY'S TREATMENT: 11/08/23 Activity Comments  Pt education -discussion of relevant anatomy and kinematics. Demo/discussion of ergonomics and body mechanics for static standing and work-based activities such as overhead lifting  Front plank 2x30 sec                 PATIENT EDUCATION: Education details: Initial eval, POC; initial HEP and education in lumbar spine neutral position with abdominal activation Person educated: Patient Education method: Explanation, Demonstration, and Handouts Education comprehension: verbalized understanding and returned demonstration  HOME EXERCISE PROGRAM: Access Code: [email or web address] URL: [email or web address] Date: 11/04/2023 Prepared by: [facility] - Outpatient  Rehab - [facility]  Exercises - Seated  Pelvic Tilt  - 1 x daily - 7 x weekly - 3 reps - Tall Kneeling Posterior Pelvic Tilt  - 1 x daily - 7 x weekly - 1 sets - 3 reps - Standing Lumbar Spine Flexion Stretch Counter  - 1 x daily - 7 x weekly - 1 sets - 3 reps - 15-30 sec hold - Standard Plank  - 1 x daily - 7 x weekly - 3-5 sets - 30-60 sec hold  Note: Objective measures were completed at Evaluation unless otherwise noted.  DIAGNOSTIC FINDINGS: MRI lumbar spine wo contrast 10/25/2023: Grade 1-2 anterior spondylolisthesis of L5 on S1 secondary to bilateral pars defects resulting in severe bilateral foraminal narrowing. Correlation for L5 radiculopathy as described in detail above. No significant spinal stenosis.  COGNITION: Overall cognitive status: Within functional limits for tasks assessed   SENSATION: Light touch: WFL  MUSCLE LENGTH: Hamstrings: Right -15 deg (pt reports brief numbness in ant hip getting into stretch position); Left -15 deg SLR Test:  negative L, negative R Thomas Test:  20 degrees L  POSTURE: rounded shoulders, increased lumbar lordosis, and anterior pelvic tilt    LOWER EXTREMITY ROM:   Active ROM WFL  LOWER EXTREMITY MMT:    MMT Right Eval Left Eval  Hip flexion 4+ 4+  Hip extension    Hip abduction 5 5  Hip adduction 5 5  Hip internal rotation    Hip external rotation    Knee flexion 5 5  Knee extension 5 5  Ankle dorsiflexion 4+ 4+  Ankle plantarflexion    Ankle inversion 4 4  Ankle eversion 4+ 4+  (Blank rows = not tested)   TRANSFERS: Sit to stand: Complete Independence  Assistive device utilized: None     Stand to sit: Complete Independence  Assistive device utilized: None      GAIT: Findings: Gait Characteristics: step through pattern, Distance walked: clinic distances, Assistive device utilized:None, and Level of assistance: Complete Independence  FUNCTIONAL TESTS:  Repeated flexion:  No initial pain/symptoms; no symptoms upon completion Repeated extension:  No initial  symptoms, no symptoms upon completion  PATIENT SURVEYS:  Modified Oswestry 1/50 (Pt does not c/o pain, mostly numbness)  TREATMENT DATE: 11/04/2023     GOALS: Goals reviewed with patient? Yes  SHORT TERM GOALS: Target date: 11/21/2023  Pt will be independent with HEP for improved posture, strength. Baseline: Goal status: INITIAL   LONG TERM GOALS: Target date: 12/13/2023  Pt will be independent with HEP for improved posture, strength. Baseline:  Goal status: INITIAL  2.  Pt will report at least 50% decrease in episodes of RLE numbness with standing and gait. Baseline:  Goal status: INITIAL  3.  6 MWT to be assessed and distance to improve by 100 ft, with no increase in RLE numbness. Baseline: TBA Goal status: INITIAL  4.  Pt will verbalize understanding of posture, body mechanics, lifting, for decreasing numbness/lumbar symptoms. Baseline:  Goal status: INITIAL   ASSESSMENT:  CLINICAL IMPRESSION: Discussed and provided cue sheet for relevant ergonomics and body mechanics principles as it pertains to spondylolisthesis. Initiated core stabilization with front plank with slight flexion bias. Continued sessions to progress POC details to improve body mechanics and core endurance for reduced dysfunction  OBJECTIVE IMPAIRMENTS: difficulty walking, postural dysfunction, pain, and radicular symptoms/numbness RLE>LLE.   ACTIVITY LIMITATIONS: lifting, standing, locomotion level, and kneeling  PARTICIPATION LIMITATIONS: community activity, occupation, yard work, and church  PERSONAL FACTORS: 1 comorbidity: hx of spondylolisthesis are also affecting patient's functional outcome.   REHAB POTENTIAL: Good  CLINICAL DECISION MAKING: Stable/uncomplicated  EVALUATION COMPLEXITY: Low  PLAN:  PT FREQUENCY: 1-2x/week  PT DURATION: 6 weeks including eval  week  PLANNED INTERVENTIONS: 97164- PT Re-evaluation, 97750- Physical Performance Testing, 97110-Therapeutic exercises, 97530- Therapeutic activity, 97112- Neuromuscular re-education, 97535- Self Care, 95621- Manual therapy, Patient/Family education, Balance training, and Joint mobilization  PLAN FOR NEXT SESSION: Assess 6 MWT; core stabilization/lumbar exercises-try quadruped/bridging; education in body mechanics and posture for her job   [employer], PT 11/08/2023, 8:01 AM  [facility] at [facility] [address] [place], [place] [address] Phone # [phone] Fax # [phone]

## 2023-11-12 ENCOUNTER — Other Ambulatory Visit: Payer: Self-pay | Admitting: Obstetrics and Gynecology

## 2023-11-12 ENCOUNTER — Encounter: Payer: Self-pay | Admitting: Obstetrics and Gynecology

## 2023-11-12 DIAGNOSIS — N926 Irregular menstruation, unspecified: Secondary | ICD-10-CM

## 2023-11-12 DIAGNOSIS — M4316 Spondylolisthesis, lumbar region: Secondary | ICD-10-CM | POA: Diagnosis not present

## 2023-11-12 NOTE — Therapy (Signed)
 OUTPATIENT PHYSICAL THERAPY NEURO TREATMENT   Patient Name: Julie Boyd MRN: [id number] DOB:[birth year], 48 y.o., female Today's Date: 11/13/2023   PCP: Purcell Emil Schanz, MD REFERRING PROVIDER: Tobie Tonita POUR, DO   END OF SESSION:  PT End of Session - 11/13/23 0844     Visit Number 3    Number of Visits 12    Date for PT Re-Evaluation 12/13/23    Authorization Type [place] [employer]    Authorization Time Period Auth required after 27 visits    PT Start Time 0801    PT Stop Time 0844    PT Time Calculation (min) 43 min    Activity Tolerance Patient tolerated treatment well    Behavior During Therapy [facility] for tasks assessed/performed           Past Medical History:  Diagnosis Date   Allergy    Elderly multigravida 11/29/2013   First trimester bleeding 11/29/2013   HSV-1 infection    Medical history non-contributory    Vaginal delivery 11/29/2013   Past Surgical History:  Procedure Laterality Date   Polyp removal     uterine polyp   Patient Active Problem List   Diagnosis Date Noted   Adrenal mass (HCC) 10/25/2023   Abnormal MRI, lumbar spine 10/25/2023   Bilateral leg paresthesia 10/02/2023   Palpitations 07/15/2023   Patellofemoral arthritis of left knee 08/24/2022   ANA positive 07/16/2022   Arthralgia of multiple joints 07/16/2022   Seasonal and perennial allergic rhinitis 12/12/2016   Pollen-food allergy syndrome, subsequent encounter 12/12/2016   Spondylolisthesis at L5-S1 level 07/14/2015   BMI 30.0-30.9,adult 05/06/2015    ONSET DATE: 10/29/2023 (MD referral)  REFERRING DIAG: M54.16 (ICD-10-CM) - Lumbar radiculopathy   THERAPY DIAG:  Radiculopathy, lumbar region  Abnormal posture  Muscle weakness (generalized)  Rationale for Evaluation and Treatment: Rehabilitation  SUBJECTIVE:                                                                                                                                                                                              SUBJECTIVE STATEMENT: Dr. Onetha did extra xrays which showed extra movement in her vertebrae. Has a f/u appointment with him in August.   Pt accompanied by: self  PERTINENT HISTORY: Lumbar radiculopathy at L5 (right > left) due to spondylolisthesis of L5 on S1 due to bilateral pars defects causing biforaminal stenosis.   PAIN:  Are you having pain? No   PRECAUTIONS: Fall  RED FLAGS: None   WEIGHT BEARING RESTRICTIONS: No  FALLS: Has patient fallen in last 6 months? 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  OBJECTIVE:      TODAY'S TREATMENT: 11/13/23 Activity Comments  6 MWT Did not proceed with this d/t pt reporting that she can walk as long as she needs.Rather she has R LE N/T after periods of standing and c/o pressure in the anterior hips with kneeling.    bridge ball 10x  bridge red TB 10x  Cueing for core contraction   pelvic tilts Verbal and manual cueing to coordinate properly  hooklying overhead green medball lift 10x Cueing to maintain posterior pelvic tilt  Deadbug 90/90 position with knees bent  Some difficulty coordinating. C/o difficulty and muscle fatigue      PATIENT EDUCATION: Education details: edu on avoiding end-range spinal extension to avoid exacerbating pars defect, answered pt's questions about positions/activities to promote or avoid, HEP update Person educated: Patient Education method: Explanation, Demonstration, Tactile cues, Verbal cues, and Handouts Education comprehension: verbalized understanding and returned demonstration    HOME EXERCISE PROGRAM: Access Code: [email or web address] URL:  [email or web address] Date: 11/13/2023 Prepared by: [facility] - Outpatient  Rehab - [facility]  Exercises - Seated Pelvic Tilt  - 1 x daily - 7 x weekly - 3 reps - Tall Kneeling Posterior Pelvic Tilt  - 1 x daily - 7 x weekly - 1 sets - 3 reps - Standing Lumbar Spine Flexion Stretch Counter  - 1 x daily - 7 x weekly - 1 sets - 3 reps - 15-30 sec hold - Standard Plank  - 1 x daily - 7 x weekly - 3-5 sets - 30-60 sec hold - Supine Bridge with Resistance Band  - 1 x daily - 5 x weekly - 2 sets - 10 reps - Supine Posterior Pelvic Tilt  - 1 x daily - 5 x weekly - 2 sets - 10 reps - Dead Bug  - 1 x daily - 5 x weekly - 2 sets - 10 reps    Note: Objective measures were completed at Evaluation unless otherwise noted.  DIAGNOSTIC FINDINGS: MRI lumbar spine wo contrast 10/25/2023: Grade 1-2 anterior spondylolisthesis of L5 on S1 secondary to bilateral pars defects resulting in severe bilateral foraminal narrowing. Correlation for L5 radiculopathy as described in detail above. No significant spinal stenosis.  COGNITION: Overall cognitive status: Within functional limits for tasks assessed   SENSATION: Light touch: WFL  MUSCLE LENGTH: Hamstrings: Right -15 deg (pt reports brief numbness in ant hip getting into stretch position); Left -15 deg SLR Test:  negative L, negative R Thomas Test:  20 degrees L  POSTURE: rounded shoulders, increased lumbar lordosis, and anterior pelvic tilt    LOWER EXTREMITY ROM:   Active ROM WFL  LOWER EXTREMITY MMT:    MMT Right Eval Left Eval  Hip flexion 4+ 4+  Hip extension    Hip abduction 5 5  Hip adduction 5 5  Hip internal rotation    Hip external rotation    Knee flexion 5 5  Knee extension 5 5  Ankle dorsiflexion 4+ 4+  Ankle plantarflexion    Ankle inversion 4 4  Ankle eversion 4+ 4+  (Blank rows = not tested)   TRANSFERS: Sit to stand: Complete Independence  Assistive device utilized: None     Stand to sit:  Complete Independence  Assistive device utilized: None      GAIT: Findings: Gait Characteristics: step through pattern, Distance walked: clinic distances, Assistive device utilized:None, and Level of assistance: Complete Independence  FUNCTIONAL TESTS:  Repeated flexion:  No initial pain/symptoms; no symptoms upon completion Repeated extension:  No initial symptoms, no symptoms upon completion  PATIENT SURVEYS:  Modified Oswestry 1/50 (Pt does not c/o pain, mostly numbness)                                                                                                                               TREATMENT DATE: 11/04/2023     GOALS: Goals reviewed with patient? Yes  SHORT TERM GOALS: Target date: 11/21/2023  Pt will be independent with HEP for improved posture, strength. Baseline: Goal status: IN PROGRESS   LONG TERM GOALS: Target date: 12/13/2023  Pt will be independent with HEP for improved posture, strength. Baseline:  Goal status:IN PROGRESS  2.  Pt will report at least 50% decrease in episodes of RLE numbness with standing and gait. Baseline:  Goal status: IN PROGRESS  3.  6 MWT to be assessed and distance to improve by 100 ft, with no increase in RLE numbness. Baseline: TBA Goal status:DEFERRED- pt reports no issues with walking 11/13/23  4.  Pt will verbalize understanding of posture, body mechanics, lifting, for decreasing numbness/lumbar symptoms. Baseline:  Goal status:IN PROGRESS   ASSESSMENT:  CLINICAL IMPRESSION: Patient arrived to session with report of new imaging done on her spine; Neurosurgery f/u scheduled in August. was not performed as pt reports good tolerance for long distance walking, rather reports N/T in R LE when standing for various periods and c/o pressure in anterior hips when kneeling/walking uphill/downhill. Core strengthening activities focused on maintaining muscle contraction and neutral spine throughout. All of activities were  performed pain-free today. No complaints at end of session.   OBJECTIVE IMPAIRMENTS: difficulty walking, postural dysfunction, pain, and radicular symptoms/numbness RLE>LLE.   ACTIVITY LIMITATIONS: lifting, standing, locomotion level, and kneeling  PARTICIPATION LIMITATIONS: community activity, occupation, yard work, and church  PERSONAL FACTORS: 1 comorbidity: hx of spondylolisthesis are also affecting patient's functional outcome.   REHAB POTENTIAL: Good  CLINICAL DECISION MAKING: Stable/uncomplicated  EVALUATION COMPLEXITY: Low  PLAN:  PT FREQUENCY: 1-2x/week  PT DURATION: 6 weeks including eval week  PLANNED INTERVENTIONS: 97164- PT Re-evaluation, 97750- Physical Performance Testing, 97110-Therapeutic exercises, 97530- Therapeutic activity, 97112- Neuromuscular re-education, 97535- Self Care, 02859- Manual therapy, Patient/Family education, Balance training, and Joint mobilization  PLAN FOR NEXT SESSION:  core stabilization/lumbar exercises-try quadruped/bridging; education in body mechanics and posture for her job  Louana Terrilyn Christians, [place], DPT 11/13/23 8:46 AM  [facility] Outpatient Rehab at [facility] [address] [place], [place] [address] Phone # [phone] Fax # [phone]

## 2023-11-12 NOTE — Progress Notes (Signed)
 STAT quant hCG ordered for 11/13/23.

## 2023-11-13 ENCOUNTER — Encounter: Payer: Self-pay | Admitting: Physical Therapy

## 2023-11-13 ENCOUNTER — Ambulatory Visit: Payer: Self-pay | Admitting: Obstetrics and Gynecology

## 2023-11-13 ENCOUNTER — Ambulatory Visit: Admitting: Physical Therapy

## 2023-11-13 ENCOUNTER — Other Ambulatory Visit

## 2023-11-13 ENCOUNTER — Other Ambulatory Visit: Payer: Self-pay | Admitting: Obstetrics and Gynecology

## 2023-11-13 ENCOUNTER — Encounter: Admitting: Gastroenterology

## 2023-11-13 DIAGNOSIS — M5416 Radiculopathy, lumbar region: Secondary | ICD-10-CM | POA: Diagnosis not present

## 2023-11-13 DIAGNOSIS — R293 Abnormal posture: Secondary | ICD-10-CM

## 2023-11-13 DIAGNOSIS — M6281 Muscle weakness (generalized): Secondary | ICD-10-CM

## 2023-11-13 DIAGNOSIS — N926 Irregular menstruation, unspecified: Secondary | ICD-10-CM | POA: Diagnosis not present

## 2023-11-13 LAB — HCG, QUANTITATIVE, PREGNANCY: HCG, Total, QN: <5 m[IU]/mL

## 2023-11-13 NOTE — Telephone Encounter (Signed)
 Spoke with patient. Patient scheduled for lab appt this morning at 0920.   Routing to provider for final review. Patient is agreeable to disposition. Will close encounter.

## 2023-11-14 ENCOUNTER — Other Ambulatory Visit

## 2023-11-15 ENCOUNTER — Encounter: Payer: Self-pay | Admitting: Physical Therapy

## 2023-11-15 ENCOUNTER — Ambulatory Visit
Admission: RE | Admit: 2023-11-15 | Discharge: 2023-11-15 | Disposition: A | Discharge status: Home / Self Care | Source: Ambulatory Visit | Attending: Emergency Medicine | Admitting: Emergency Medicine

## 2023-11-15 ENCOUNTER — Ambulatory Visit: Admitting: Physical Therapy

## 2023-11-15 DIAGNOSIS — K5900 Constipation, unspecified: Secondary | ICD-10-CM | POA: Diagnosis not present

## 2023-11-15 DIAGNOSIS — M6281 Muscle weakness (generalized): Secondary | ICD-10-CM | POA: Diagnosis not present

## 2023-11-15 DIAGNOSIS — R293 Abnormal posture: Secondary | ICD-10-CM | POA: Diagnosis not present

## 2023-11-15 DIAGNOSIS — E278 Other specified disorders of adrenal gland: Secondary | ICD-10-CM | POA: Diagnosis not present

## 2023-11-15 DIAGNOSIS — M5416 Radiculopathy, lumbar region: Secondary | ICD-10-CM | POA: Diagnosis not present

## 2023-11-15 MED ORDER — IOPAMIDOL (ISOVUE-300) INJECTION 61%
100.0000 mL | Freq: Once | INTRAVENOUS | Status: AC | PRN
  Administered 2023-11-15: 100 mL via INTRAVENOUS

## 2023-11-15 NOTE — Therapy (Signed)
 OUTPATIENT PHYSICAL THERAPY NEURO TREATMENT   Patient Name: Julie Boyd MRN: [id number] DOB:[birth year], 48 y.o., female Today's Date: 11/15/2023   PCP: Purcell Emil Schanz, MD REFERRING PROVIDER: Tobie Tonita POUR, DO   END OF SESSION:  PT End of Session - 11/15/23 0803     Visit Number 4    Number of Visits 12    Date for PT Re-Evaluation 12/13/23    Authorization Type [place] [employer]    Authorization Time Period Auth required after 27 visits    PT Start Time 0804    PT Stop Time 0844    PT Time Calculation (min) 40 min    Activity Tolerance Patient tolerated treatment well    Behavior During Therapy [facility] for tasks assessed/performed            Past Medical History:  Diagnosis Date   Allergy    Elderly multigravida 11/29/2013   First trimester bleeding 11/29/2013   HSV-1 infection    Medical history non-contributory    Vaginal delivery 11/29/2013   Past Surgical History:  Procedure Laterality Date   Polyp removal     uterine polyp   Patient Active Problem List   Diagnosis Date Noted   Adrenal mass (HCC) 10/25/2023   Abnormal MRI, lumbar spine 10/25/2023   Bilateral leg paresthesia 10/02/2023   Palpitations 07/15/2023   Patellofemoral arthritis of left knee 08/24/2022   ANA positive 07/16/2022   Arthralgia of multiple joints 07/16/2022   Seasonal and perennial allergic rhinitis 12/12/2016   Pollen-food allergy syndrome, subsequent encounter 12/12/2016   Spondylolisthesis at L5-S1 level 07/14/2015   BMI 30.0-30.9,adult 05/06/2015    ONSET DATE: 10/29/2023 (MD referral)  REFERRING DIAG: M54.16 (ICD-10-CM) - Lumbar radiculopathy   THERAPY DIAG:  Abnormal posture  Muscle weakness (generalized)  Rationale for Evaluation and Treatment: Rehabilitation  SUBJECTIVE:                                                                                                                                                                                              SUBJECTIVE STATEMENT: Doing well; haven't had any more episodes of the numbness since Sunday.  Was able to go to church Wednesday and kneel; just have to think so much about the positioning of my low back.  Pt accompanied by: self  PERTINENT HISTORY: Lumbar radiculopathy at L5 (right > left) due to spondylolisthesis of L5 on S1 due to bilateral pars defects causing biforaminal stenosis.   PAIN:  Are you having pain? No   PRECAUTIONS: Fall  RED FLAGS: None   WEIGHT BEARING RESTRICTIONS: No  FALLS: Has patient fallen in last 6  months? 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  OBJECTIVE:        TODAY'S TREATMENT: 11/15/23 Activity Comments     Bridge red TB 10x-review Good form  Review of pelvic tilts Verbal and manual cueing to coordinate properly  Hooklying resisted clamshell, 2 x 5 with abdominals set Red band  hooklying overhead green medball lift 10x Cueing to maintain posterior pelvic tilt  Deadbug 90/90 position with knees bent -review  C/o difficulty and muscle fatigue  Good form, extra time to coordinate  Seated abdominal stabilization: Alt UE lifts BUE lifts with green ball BUE diagonal lifts with green ball Cues for pelvic neutral with abdominal activation  Seated pelvic tilts-posterior to neutral  Cues for abdominal activation at neutral  Quadruped alternating arm lifts x 5 Alternating leg lifts x 5 Bird dog x 5 Cues for abdominal activation      HOME EXERCISE PROGRAM: Access Code: [email or web address] URL: [email or web address] Date: 11/15/2023 Prepared by: [facility] - Outpatient  Rehab - [facility]  Exercises - Seated Pelvic Tilt  - 1 x daily - 7 x weekly - 3 reps - Tall Kneeling Posterior Pelvic Tilt  - 1 x daily - 7 x weekly - 1 sets - 3 reps - Standing Lumbar Spine Flexion Stretch Counter  - 1 x daily - 7 x weekly - 1 sets - 3 reps - 15-30 sec hold - Standard Plank  - 1 x daily - 7 x weekly - 3-5 sets - 30-60 sec hold - Supine Bridge with Resistance Band  - 1 x daily - 5 x weekly - 2 sets - 10 reps - Supine Posterior Pelvic Tilt  - 1 x daily - 5 x weekly - 2 sets - 10 reps - Dead Bug  - 1 x daily - 5 x weekly - 2 sets - 10 reps - Bird Dog  - 1 x daily - 7 x weekly - 2 sets - 10 reps  PATIENT EDUCATION: Education details: Review of HEP and update of HEP; discussed sleeping positions with use of pillows for support between knees/under knees Person educated: Patient Education method: Explanation, Demonstration, Tactile cues, Verbal cues, and Handouts Education comprehension: verbalized understanding and returned demonstration        Note: Objective measures were completed at Evaluation unless otherwise noted.  DIAGNOSTIC FINDINGS: MRI lumbar spine wo contrast 10/25/2023: Grade 1-2 anterior spondylolisthesis of L5 on S1 secondary to bilateral pars defects resulting in severe bilateral foraminal narrowing. Correlation for L5 radiculopathy as described in detail above. No significant spinal stenosis.  COGNITION: Overall cognitive status: Within functional limits for tasks assessed   SENSATION: Light touch: WFL  MUSCLE LENGTH: Hamstrings: Right -15 deg (pt reports brief numbness in ant hip getting into stretch position); Left -15 deg SLR Test:  negative L, negative R Thomas Test:  20 degrees L  POSTURE: rounded shoulders, increased lumbar lordosis, and anterior pelvic tilt    LOWER EXTREMITY ROM:   Active ROM WFL  LOWER EXTREMITY MMT:    MMT Right Eval Left Eval  Hip flexion 4+ 4+  Hip extension    Hip abduction 5 5  Hip adduction 5 5  Hip internal  rotation    Hip  external rotation    Knee flexion 5 5  Knee extension 5 5  Ankle dorsiflexion 4+ 4+  Ankle plantarflexion    Ankle inversion 4 4  Ankle eversion 4+ 4+  (Blank rows = not tested)   TRANSFERS: Sit to stand: Complete Independence  Assistive device utilized: None     Stand to sit: Complete Independence  Assistive device utilized: None      GAIT: Findings: Gait Characteristics: step through pattern, Distance walked: clinic distances, Assistive device utilized:None, and Level of assistance: Complete Independence  FUNCTIONAL TESTS:  Repeated flexion:  No initial pain/symptoms; no symptoms upon completion Repeated extension:  No initial symptoms, no symptoms upon completion  PATIENT SURVEYS:  Modified Oswestry 1/50 (Pt does not c/o pain, mostly numbness)                                                                                                                               TREATMENT DATE: 11/04/2023     GOALS: Goals reviewed with patient? Yes  SHORT TERM GOALS: Target date: 11/21/2023  Pt will be independent with HEP for improved posture, strength. Baseline: Goal status: IN PROGRESS   LONG TERM GOALS: Target date: 12/13/2023  Pt will be independent with HEP for improved posture, strength. Baseline:  Goal status:IN PROGRESS  2.  Pt will report at least 50% decrease in episodes of RLE numbness with standing and gait. Baseline:  Goal status: IN PROGRESS  3.  6 MWT to be assessed and distance to improve by 100 ft, with no increase in RLE numbness. Baseline: TBA Goal status:DEFERRED- pt reports no issues with walking 11/13/23  4.  Pt will verbalize understanding of posture, body mechanics, lifting, for decreasing numbness/lumbar symptoms. Baseline:  Goal status:IN PROGRESS   ASSESSMENT:  CLINICAL IMPRESSION: Pt presents today with reports of no numbness episodes since Sunday. Skilled PT session focused on review and progression of HEP to reinforce  neutral pelvis and posterior tilt with abdominal activation. Pt needs cues throughout to help find/maintain neutral pelvis with abdominal activation.  She reports no pain and no numbness sensations in session today; she does have questions about how to progress so she can get back to gym exercises and assured her we will continue to update HEP and progress towards appropriate gym/fitness routine.    OBJECTIVE IMPAIRMENTS: difficulty walking, postural dysfunction, pain, and radicular symptoms/numbness RLE>LLE.   ACTIVITY LIMITATIONS: lifting, standing, locomotion level, and kneeling  PARTICIPATION LIMITATIONS: community activity, occupation, yard work, and church  PERSONAL FACTORS: 1 comorbidity: hx of spondylolisthesis are also affecting patient's functional outcome.   REHAB POTENTIAL: Good  CLINICAL DECISION MAKING: Stable/uncomplicated  EVALUATION COMPLEXITY: Low  PLAN:  PT FREQUENCY: 1-2x/week  PT DURATION: 6 weeks including eval week  PLANNED INTERVENTIONS: 97164- PT Re-evaluation, 97750- Physical Performance Testing, 97110-Therapeutic exercises, 97530- Therapeutic activity, [id number]- Neuromuscular re-education, 97535- Self Care, [id number]- Manual therapy, Patient/Family education, Balance training, and Joint mobilization  PLAN FOR NEXT SESSION:  Continue to progress core stabilization/lumbar exercises-progress quadruped/bridging; squats, weighted carry and discuss/practice exercises she can do in the gym safely; consider upper trunk/upper back strenthening, ?Paloff press with attention to neutral spine  Greig Anon, PT 11/15/23 10:37 AM Phone: [phone] Fax: [phone]  [facility] Health Outpatient Rehab at [facility] [address] [place], [address] [place], [place] [address] Phone # [phone] Fax # [phone]

## 2023-11-18 ENCOUNTER — Ambulatory Visit: Payer: Self-pay | Admitting: Emergency Medicine

## 2023-11-18 ENCOUNTER — Other Ambulatory Visit: Payer: Self-pay | Admitting: Radiology

## 2023-11-18 DIAGNOSIS — E278 Other specified disorders of adrenal gland: Secondary | ICD-10-CM

## 2023-11-18 NOTE — Telephone Encounter (Signed)
 Benign adenomatous do not turn cancerous.  No need for removal.  If she wants to see an endocrinologist, place a referral please.  Thanks.

## 2023-11-19 ENCOUNTER — Ambulatory Visit: Attending: Internal Medicine | Admitting: Internal Medicine

## 2023-11-19 ENCOUNTER — Encounter: Payer: Self-pay | Admitting: Internal Medicine

## 2023-11-19 VITALS — BP 93/65 | HR 52 | Resp 14 | Ht 61.0 in | Wt 180.0 lb

## 2023-11-19 DIAGNOSIS — R7982 Elevated C-reactive protein (CRP): Secondary | ICD-10-CM | POA: Diagnosis not present

## 2023-11-19 DIAGNOSIS — M4317 Spondylolisthesis, lumbosacral region: Secondary | ICD-10-CM | POA: Diagnosis not present

## 2023-11-19 DIAGNOSIS — M1712 Unilateral primary osteoarthritis, left knee: Secondary | ICD-10-CM | POA: Insufficient documentation

## 2023-11-19 DIAGNOSIS — M2550 Pain in unspecified joint: Secondary | ICD-10-CM | POA: Insufficient documentation

## 2023-11-19 DIAGNOSIS — M7702 Medial epicondylitis, left elbow: Secondary | ICD-10-CM | POA: Diagnosis not present

## 2023-11-19 DIAGNOSIS — R768 Other specified abnormal immunological findings in serum: Secondary | ICD-10-CM | POA: Diagnosis not present

## 2023-11-20 ENCOUNTER — Ambulatory Visit

## 2023-11-20 LAB — ANTI-DNA ANTIBODY, DOUBLE-STRANDED: ds DNA Ab: 3 [IU]/mL

## 2023-11-20 LAB — ANTI-SMITH ANTIBODY: ENA SM Ab Ser-aCnc: <1 AI

## 2023-11-20 LAB — C3 AND C4
C3 Complement: 140 mg/dL (ref 83–193)
C4 Complement: 19 mg/dL (ref 15–57)

## 2023-11-20 LAB — SEDIMENTATION RATE: Sed Rate: 9 mm/h (ref 0–20)

## 2023-11-20 LAB — C-REACTIVE PROTEIN: CRP: <3 mg/L (ref <8.0)

## 2023-11-20 LAB — SJOGRENS SYNDROME-A EXTRACTABLE NUCLEAR ANTIBODY: SSA (Ro) (ENA) Antibody, IgG: <1 AI

## 2023-11-21 ENCOUNTER — Ambulatory Visit: Payer: Self-pay | Admitting: Internal Medicine

## 2023-11-21 NOTE — Progress Notes (Signed)
 Lab results all still look very good with normal sed rate and CRP and complements.  Her repeated antibody test for lupus or Sjogren's syndrome also remained negative.  Believe her symptoms remain better explained by osteoarthritis and some muscular and tendinitis pain not coming from an autoimmune disease problem. I recommend she continue working with physical therapy and with NSAIDs as needed for pain relief and specific joints.

## 2023-11-25 NOTE — Therapy (Signed)
 OUTPATIENT PHYSICAL THERAPY NEURO TREATMENT   Patient Name: Julie Boyd MRN: [id number] DOB:10-Jan-1976, 48 y.o., female Today's Date: 11/27/2023   PCP: Purcell Emil Schanz, MD REFERRING PROVIDER: Tobie Tonita POUR, DO   END OF SESSION:  PT End of Session - 11/27/23 0829     Visit Number 5    Number of Visits 12    Date for PT Re-Evaluation 12/13/23    Authorization Type [place] [employer]    Authorization Time Period Auth required after 27 visits    PT Start Time 0801    PT Stop Time 0842    PT Time Calculation (min) 41 min    Activity Tolerance Patient tolerated treatment well    Behavior During Therapy [facility] for tasks assessed/performed             Past Medical History:  Diagnosis Date   Allergy    Elderly multigravida 11/29/2013   First trimester bleeding 11/29/2013   HSV-1 infection    Medical history non-contributory    Vaginal delivery 11/29/2013   Past Surgical History:  Procedure Laterality Date   Polyp removal     uterine polyp   Patient Active Problem List   Diagnosis Date Noted   Adrenal mass (HCC) 10/25/2023   Abnormal MRI, lumbar spine 10/25/2023   Bilateral leg paresthesia 10/02/2023   Palpitations 07/15/2023   Patellofemoral arthritis of left knee 08/24/2022   ANA positive 07/16/2022   Arthralgia of multiple joints 07/16/2022   Seasonal and perennial allergic rhinitis 12/12/2016   Pollen-food allergy syndrome, subsequent encounter 12/12/2016   Spondylolisthesis at L5-S1 level 07/14/2015   BMI 30.0-30.9,adult 05/06/2015    ONSET DATE: 10/29/2023 (MD referral)  REFERRING DIAG: M54.16 (ICD-10-CM) - Lumbar radiculopathy   THERAPY DIAG:  Abnormal posture  Muscle weakness (generalized)  Radiculopathy, lumbar region  Rationale for Evaluation and Treatment: Rehabilitation  SUBJECTIVE:                                                                                                                                                                                              SUBJECTIVE STATEMENT: Nothing new. Haven't had any numbness in the last 2-3 weeks. Reports that she was cleaning the pool yesterday and back feels fatigued.   Pt accompanied by: self  PERTINENT HISTORY: Lumbar radiculopathy at L5 (right > left) due to spondylolisthesis of L5 on S1 due to bilateral pars defects causing biforaminal stenosis.   PAIN:  Are you having pain? No   PRECAUTIONS: Fall  RED FLAGS: None   WEIGHT BEARING RESTRICTIONS: No  FALLS: Has patient fallen in last 6 months? 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  OBJECTIVE:     TODAY'S TREATMENT: 11/27/23 Activity Comments  Birddog  Verbal cueing and used ball on back as a cue to keep spine straight   tall kneeling blue TB hip hinges 2x10 Knee cavitation but no pain  Blue TB row 2x15  Cueing to reduce shoulder elevation and avoid shoulder extension past midline   Blue TB shoulder extension 2x10 Good form  paloff press blue blue TB 10x each  Staggered stance      PATIENT EDUCATION: Education details: discussed DC within coming visits- pt agreeable, HEP update Person educated: Patient Education method: Explanation, Demonstration, Tactile cues, Verbal cues, and Handouts Education comprehension: verbalized understanding and returned demonstration    HOME EXERCISE PROGRAM: Access Code: [email or web address] URL: [email or web address] Date: 11/27/2023 Prepared by: [facility] - Outpatient  Rehab - [facility]  Exercises - Seated Pelvic Tilt  - 1 x daily - 7 x weekly - 3 reps - Tall Kneeling Posterior Pelvic Tilt  - 1 x daily - 7 x  weekly - 1 sets - 3 reps - Standing Lumbar Spine Flexion Stretch Counter  - 1 x daily - 7 x weekly - 1 sets - 3 reps - 15-30 sec hold - Standard Plank  - 1 x daily - 7 x weekly - 3-5 sets - 30-60 sec hold - Supine Bridge with Resistance Band  - 1 x daily - 5 x weekly - 2 sets - 10 reps - Supine Posterior Pelvic Tilt  - 1 x daily - 5 x weekly - 2 sets - 10 reps - Dead Bug  - 1 x daily - 5 x weekly - 2 sets - 10 reps - Bird Dog  - 1 x daily - 7 x weekly - 2 sets - 10 reps - Beginner Front Arm Support  - 1 x daily - 7 x weekly - 2 sets - 10 reps - Standing Anti-Rotation Press with Anchored Resistance  - 1 x daily - 5 x weekly - 2 sets - 10 reps       Note: Objective measures were completed at Evaluation unless otherwise noted.  DIAGNOSTIC FINDINGS: MRI lumbar spine wo contrast 10/25/2023: Grade 1-2 anterior spondylolisthesis of L5 on S1 secondary to bilateral pars defects resulting in severe bilateral foraminal narrowing. Correlation for L5 radiculopathy as described in detail above. No significant spinal stenosis.  COGNITION: Overall cognitive status: Within functional limits for tasks assessed   SENSATION: Light touch: WFL  MUSCLE LENGTH: Hamstrings: Right -15 deg (pt reports brief numbness in ant hip getting into stretch position); Left -15 deg SLR Test:  negative L, negative R Thomas Test:  20 degrees L  POSTURE: rounded shoulders, increased lumbar lordosis, and anterior pelvic tilt    LOWER EXTREMITY ROM:   Active ROM WFL  LOWER EXTREMITY MMT:    MMT Right Eval Left Eval  Hip flexion 4+ 4+  Hip extension    Hip abduction 5 5  Hip adduction 5 5  Hip internal rotation    Hip external rotation    Knee flexion 5 5  Knee extension 5 5  Ankle dorsiflexion 4+ 4+  Ankle plantarflexion    Ankle inversion 4 4  Ankle eversion 4+ 4+  (  Blank rows = not tested)   TRANSFERS: Sit to stand: Complete Independence  Assistive device utilized: None     Stand to sit: Complete  Independence  Assistive device utilized: None      GAIT: Findings: Gait Characteristics: step through pattern, Distance walked: clinic distances, Assistive device utilized:None, and Level of assistance: Complete Independence  FUNCTIONAL TESTS:  Repeated flexion:  No initial pain/symptoms; no symptoms upon completion Repeated extension:  No initial symptoms, no symptoms upon completion  PATIENT SURVEYS:  Modified Oswestry 1/50 (Pt does not c/o pain, mostly numbness)                                                                                                                               TREATMENT DATE: 11/04/2023     GOALS: Goals reviewed with patient? Yes  SHORT TERM GOALS: Target date: 11/21/2023  Pt will be independent with HEP for improved posture, strength. Baseline: Goal status: IN PROGRESS   LONG TERM GOALS: Target date: 12/13/2023  Pt will be independent with HEP for improved posture, strength. Baseline:  Goal status:IN PROGRESS  2.  Pt will report at least 50% decrease in episodes of RLE numbness with standing and gait. Baseline:  Goal status: IN PROGRESS  3.  6 MWT to be assessed and distance to improve by 100 ft, with no increase in RLE numbness. Baseline: TBA Goal status:DEFERRED- pt reports no issues with walking 11/13/23  4.  Pt will verbalize understanding of posture, body mechanics, lifting, for decreasing numbness/lumbar symptoms. Baseline:  Goal status:IN PROGRESS   ASSESSMENT:  CLINICAL IMPRESSION: Patient arrived to session with report of no N/T in the past 2-3 weeks. Continued working on core and glute strengthening activities. Good carryover of quadruped strengthening after cueing provided. Good effort to correct according to cueing with additional exercises today. Patient tolerated session well and without complaints upon leaving.     OBJECTIVE IMPAIRMENTS: difficulty walking, postural dysfunction, pain, and radicular symptoms/numbness RLE>LLE.    ACTIVITY LIMITATIONS: lifting, standing, locomotion level, and kneeling  PARTICIPATION LIMITATIONS: community activity, occupation, yard work, and church  PERSONAL FACTORS: 1 comorbidity: hx of spondylolisthesis are also affecting patient's functional outcome.   REHAB POTENTIAL: Good  CLINICAL DECISION MAKING: Stable/uncomplicated  EVALUATION COMPLEXITY: Low  PLAN:  PT FREQUENCY: 1-2x/week  PT DURATION: 6 weeks including eval week  PLANNED INTERVENTIONS: 97164- PT Re-evaluation, 97750- Physical Performance Testing, 97110-Therapeutic exercises, 97530- Therapeutic activity, 97112- Neuromuscular re-education, 97535- Self Care, 02859- Manual therapy, Patient/Family education, Balance training, and Joint mobilization  PLAN FOR NEXT SESSION:  DC within coming visits; Continue to progress core stabilization/lumbar exercises-progress quadruped/bridging; squats, weighted carry and discuss/practice exercises she can do in the gym safely   Louana Terrilyn Christians, PT, DPT 11/27/23 8:46 AM  [facility] at [facility] [address] [place], [place] [address] Phone # [phone] Fax # [phone]

## 2023-11-27 ENCOUNTER — Telehealth: Payer: Self-pay | Admitting: Gastroenterology

## 2023-11-27 ENCOUNTER — Ambulatory Visit: Attending: Neurology | Admitting: Physical Therapy

## 2023-11-27 ENCOUNTER — Encounter: Payer: Self-pay | Admitting: Physical Therapy

## 2023-11-27 DIAGNOSIS — R293 Abnormal posture: Secondary | ICD-10-CM | POA: Diagnosis not present

## 2023-11-27 DIAGNOSIS — M6281 Muscle weakness (generalized): Secondary | ICD-10-CM | POA: Diagnosis not present

## 2023-11-27 DIAGNOSIS — M5416 Radiculopathy, lumbar region: Secondary | ICD-10-CM | POA: Insufficient documentation

## 2023-11-27 NOTE — Telephone Encounter (Signed)
 Returned call to patient, VM obtained and message left that new prep instructions, reflecting rescheduled colonoscopy date of 12/06/23, sent to [facility].  Instructed to call back if she had any questions.

## 2023-11-27 NOTE — Telephone Encounter (Signed)
 Inbound call from patient requesting a call to discuss prep instructions further regarding 7/18 colonoscopy. Please advise, thank you

## 2023-11-29 ENCOUNTER — Ambulatory Visit: Admitting: Physical Therapy

## 2023-12-02 ENCOUNTER — Ambulatory Visit: Admitting: Neurology

## 2023-12-04 ENCOUNTER — Ambulatory Visit: Admitting: Physical Therapy

## 2023-12-04 ENCOUNTER — Encounter: Payer: Self-pay | Admitting: Physical Therapy

## 2023-12-04 DIAGNOSIS — M6281 Muscle weakness (generalized): Secondary | ICD-10-CM | POA: Diagnosis not present

## 2023-12-04 DIAGNOSIS — R293 Abnormal posture: Secondary | ICD-10-CM

## 2023-12-04 DIAGNOSIS — M5416 Radiculopathy, lumbar region: Secondary | ICD-10-CM | POA: Diagnosis not present

## 2023-12-04 NOTE — Therapy (Signed)
 OUTPATIENT PHYSICAL THERAPY NEURO TREATMENT   Patient Name: Julie Boyd MRN: [id number] DOB:08/28/1975, 48 y.o., female Today's Date: 12/04/2023   PCP: Purcell Emil Schanz, MD REFERRING PROVIDER: Tobie Tonita POUR, DO   END OF SESSION:  PT End of Session - 12/04/23 0807     Visit Number 6    Number of Visits 12    Date for PT Re-Evaluation 12/13/23    Authorization Type [place] [employer]    Authorization Time Period Auth required after 27 visits    PT Start Time 0805    PT Stop Time 0838    PT Time Calculation (min) 33 min    Activity Tolerance Patient tolerated treatment well    Behavior During Therapy [facility] for tasks assessed/performed              Past Medical History:  Diagnosis Date   Allergy    Elderly multigravida 11/29/2013   First trimester bleeding 11/29/2013   HSV-1 infection    Medical history non-contributory    Vaginal delivery 11/29/2013   Past Surgical History:  Procedure Laterality Date   Polyp removal     uterine polyp   Patient Active Problem List   Diagnosis Date Noted   Adrenal mass (HCC) 10/25/2023   Abnormal MRI, lumbar spine 10/25/2023   Bilateral leg paresthesia 10/02/2023   Palpitations 07/15/2023   Patellofemoral arthritis of left knee 08/24/2022   ANA positive 07/16/2022   Arthralgia of multiple joints 07/16/2022   Seasonal and perennial allergic rhinitis 12/12/2016   Pollen-food allergy syndrome, subsequent encounter 12/12/2016   Spondylolisthesis at L5-S1 level 07/14/2015   BMI 30.0-30.9,adult 05/06/2015    ONSET DATE: 10/29/2023 (MD referral)  REFERRING DIAG: M54.16 (ICD-10-CM) - Lumbar radiculopathy   THERAPY DIAG:  Abnormal posture  Muscle weakness (generalized)  Rationale for Evaluation and Treatment: Rehabilitation  SUBJECTIVE:                                                                                                                                                                                              SUBJECTIVE STATEMENT: Continue to have no episodes of numbness/tingling, almost 4 weeks now.  Pt accompanied by: self  PERTINENT HISTORY: Lumbar radiculopathy at L5 (right > left) due to spondylolisthesis of L5 on S1 due to bilateral pars defects causing biforaminal stenosis.   PAIN:  Are you having pain? No   PRECAUTIONS: Fall  RED FLAGS: None   WEIGHT BEARING RESTRICTIONS: No  FALLS: Has patient fallen in last 6 months? 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  OBJECTIVE:   Problem-solved through work related tasks:  low squats for repetitive motion like spreading pine straw and standing weightshifting for activities like vaccuuming-to work on use of power BLE muscles with neutral spine; pt verbalizes and return demo understanding Lower extremity strenghthening with cues for neutral spine position Kettle bell squats, 5 x 5# Sumo squats, 5 x 5# Deadlift squat, 3 reps cues for position Golfer's lift  HOME EXERCISE PROGRAM: Access Code: [email or web address] URL: [email or web address] Date: 12/04/2023 Prepared by: [facility] - Outpatient  Rehab - [facility]  Exercises - Standing Lumbar Spine Flexion Stretch Counter  - 1 x daily - 7 x weekly - 1 sets - 3 reps - 15-30 sec hold - Standard Plank  - 1 x daily - 7 x weekly - 3-5 sets - 30-60 sec hold - Supine Bridge with Resistance Band  - 1 x daily - 5 x weekly - 2 sets - 10 reps - Supine Posterior Pelvic Tilt  - 1 x daily - 5 x weekly - 2 sets - 10 reps - Dead Bug  - 1 x daily - 5 x weekly - 2 sets - 10 reps - Bird Dog  - 1 x daily - 7 x weekly - 2 sets - 10 reps - Beginner Front  Arm Support  - 1 x daily - 7 x weekly - 2 sets - 10 reps - Standing Anti-Rotation Press with Anchored Resistance  - 1 x daily - 5 x weekly - 2 sets - 10 reps - Goblet Squat with Kettlebell  - 1 x daily - 7 x weekly - 3 sets - 10 reps - Kettlebell Deadlift  - 1 x daily - 7 x weekly - 2 sets - 5 reps - Sumo Squat with Dumbbell  - 1 x daily - 7 x weekly - 3 sets - 10 reps - Golfers Lift with Theraband  - 1 x daily - 7 x weekly   PATIENT EDUCATION: Education details: HEP update, POC and progress towards goals, plan finishing PT today (keep chart open x 1 week in case of questions, then discharge at end of week next week); review/reminder of posture/postioning with lifting techniques Person educated: Patient Education method: Explanation, Demonstration, Tactile cues, Verbal cues, and Handouts Education comprehension: verbalized understanding and returned demonstration         Note: Objective measures were completed at Evaluation unless otherwise noted.  DIAGNOSTIC FINDINGS: MRI lumbar spine wo contrast 10/25/2023: Grade 1-2 anterior spondylolisthesis of L5 on S1 secondary to bilateral pars defects resulting in severe bilateral foraminal narrowing. Correlation for L5 radiculopathy as described in detail above. No significant spinal stenosis.  COGNITION: Overall cognitive status: Within functional limits for tasks assessed   SENSATION: Light touch: WFL  MUSCLE LENGTH: Hamstrings: Right -15 deg (pt reports brief numbness in ant hip getting into stretch position); Left -15 deg SLR Test:  negative L, negative R Thomas Test:  20 degrees L  POSTURE: rounded shoulders, increased lumbar lordosis, and anterior pelvic tilt    LOWER EXTREMITY ROM:   Active ROM WFL  LOWER EXTREMITY MMT:    MMT Right Eval Left Eval  Hip flexion 4+ 4+  Hip extension    Hip abduction 5 5  Hip adduction 5 5  Hip internal rotation    Hip external  rotation    Knee flexion 5 5  Knee extension 5 5  Ankle  dorsiflexion 4+ 4+  Ankle plantarflexion    Ankle inversion 4 4  Ankle eversion 4+ 4+  (Blank rows = not tested)   TRANSFERS: Sit to stand: Complete Independence  Assistive device utilized: None     Stand to sit: Complete Independence  Assistive device utilized: None      GAIT: Findings: Gait Characteristics: step through pattern, Distance walked: clinic distances, Assistive device utilized:None, and Level of assistance: Complete Independence  FUNCTIONAL TESTS:  Repeated flexion:  No initial pain/symptoms; no symptoms upon completion Repeated extension:  No initial symptoms, no symptoms upon completion  PATIENT SURVEYS:  Modified Oswestry 1/50 (Pt does not c/o pain, mostly numbness)                                                                                                                               TREATMENT DATE: 11/04/2023     GOALS: Goals reviewed with patient? Yes  SHORT TERM GOALS: Target date: 11/21/2023  Pt will be independent with HEP for improved posture, strength. Baseline: Goal status: MET   LONG TERM GOALS: Target date: 12/13/2023  Pt will be independent with HEP for improved posture, strength. Baseline:  Goal status: MET 12/04/2023  2.  Pt will report at least 50% decrease in episodes of RLE numbness with standing and gait. Baseline:  Goal status: MET 12/04/2023  3.  6 MWT to be assessed and distance to improve by 100 ft, with no increase in RLE numbness. Baseline: TBA Goal status:DEFERRED- pt reports no issues with walking 11/13/23  4.  Pt will verbalize understanding of posture, body mechanics, lifting, for decreasing numbness/lumbar symptoms. Baseline:  Goal status: MET, 12/04/2023   ASSESSMENT:  CLINICAL IMPRESSION: Pt presents today with no new complaints; she continues to report no numbness/tingling in almost a month. Skilled PT session focused on answering questions about work related tasks in regards to posture/positioning/lift technique.   Also worked to update HEP to include varied squat lift techniques to utilize lower extremity muscles with neutral spine.  She feels like she can continue on her own; she is pleased with new exercises and feels comfortable finishing PT today.  Will hold chart open x 1 week in case she needs to return during current POC; then will plan to formally send discharge to MD.   OBJECTIVE IMPAIRMENTS: difficulty walking, postural dysfunction, pain, and radicular symptoms/numbness RLE>LLE.   ACTIVITY LIMITATIONS: lifting, standing, locomotion level, and kneeling  PARTICIPATION LIMITATIONS: community activity, occupation, yard work, and church  PERSONAL FACTORS: 1 comorbidity: hx of spondylolisthesis are also affecting patient's functional outcome.   REHAB POTENTIAL: Good  CLINICAL DECISION MAKING: Stable/uncomplicated  EVALUATION COMPLEXITY: Low  PLAN:  PT FREQUENCY: 1-2x/week  PT DURATION: 6 weeks including eval week  PLANNED INTERVENTIONS: 97164- PT Re-evaluation, 97750- Physical Performance Testing, 97110-Therapeutic exercises, 97530- Therapeutic activity, [id number]- Neuromuscular re-education, 97535- Self Care, 02859-  Manual therapy, Patient/Family education, Balance training, and Joint mobilization  PLAN FOR NEXT SESSION:  Plan to formalize discharge and send to MD, if pt does not return in 1 week.   Greig Anon, PT 12/04/23 8:46 AM Phone: [phone] Fax: [phone]   [facility] at [facility] [address], [address] [place], [place] [address] Phone # [phone] Fax # [phone]

## 2023-12-06 ENCOUNTER — Encounter: Admitting: Gastroenterology

## 2024-01-06 ENCOUNTER — Encounter: Payer: Self-pay | Admitting: Gastroenterology

## 2024-01-06 ENCOUNTER — Ambulatory Visit (AMBULATORY_SURGERY_CENTER): Admitting: Gastroenterology

## 2024-01-06 VITALS — BP 119/75 | HR 67 | Temp 98.1°F | Resp 12 | Ht 61.5 in | Wt 171.0 lb

## 2024-01-06 DIAGNOSIS — K644 Residual hemorrhoidal skin tags: Secondary | ICD-10-CM

## 2024-01-06 DIAGNOSIS — K648 Other hemorrhoids: Secondary | ICD-10-CM | POA: Diagnosis not present

## 2024-01-06 DIAGNOSIS — Z1211 Encounter for screening for malignant neoplasm of colon: Secondary | ICD-10-CM | POA: Diagnosis not present

## 2024-01-06 DIAGNOSIS — D120 Benign neoplasm of cecum: Secondary | ICD-10-CM | POA: Diagnosis not present

## 2024-01-06 MED ORDER — SODIUM CHLORIDE 0.9 % IV SOLN: 500.0000 mL | Freq: Once | INTRAVENOUS | Status: DC

## 2024-01-06 NOTE — Op Note (Signed)
 [facility] Patient Name: Julie Boyd Procedure Date: 01/06/2024 3:19 PM MRN: [id number] Endoscopist: Gustav ALONSO Mcgee , MD, [id number] Age: 48 Referring MD:  Date of Birth: [birth year] Gender: Female Account #: [id number] Procedure:                Colonoscopy Indications:              Screening for colorectal malignant neoplasm Medicines:                Monitored Anesthesia Care Procedure:                Pre-Anesthesia Assessment:                           - Prior to the procedure, a History and Physical                            was performed, and patient medications and                            allergies were reviewed. The patient's tolerance of                            previous anesthesia was also reviewed. The risks                            and benefits of the procedure and the sedation                            options and risks were discussed with the patient.                            All questions were answered, and informed consent                            was obtained. Prior Anticoagulants: The patient has                            taken no anticoagulant or antiplatelet agents. ASA                            Grade Assessment: I - A normal, healthy patient.                            After reviewing the risks and benefits, the patient                            was deemed in satisfactory condition to undergo the                            procedure.                           After obtaining informed consent, the colonoscope  was passed under direct vision. Throughout the                            procedure, the patient's blood pressure, pulse, and                            oxygen saturations were monitored continuously. The                            [id number]-[id number] Colonoscope [id number] was introduced                            through the anus and advanced to the the cecum,                            identified by  appendiceal orifice and ileocecal                            valve. The colonoscopy was performed without                            difficulty. The patient tolerated the procedure                            well. The quality of the bowel preparation was                            good. The ileocecal valve, appendiceal orifice, and                            rectum were photographed. Scope In: 3:42:41 PM Scope Out: 3:55:42 PM Scope Withdrawal Time: 0 hours 9 minutes 53 seconds  Total Procedure Duration: 0 hours 13 minutes 1 second  Findings:                 The perianal and digital rectal examinations were                            normal.                           A 10 mm polyp was found in the cecum. The polyp was                            sessile. The polyp was removed with a cold snare.                            Resection and retrieval were complete.                           Non-bleeding external and internal hemorrhoids were                            found during retroflexion. The hemorrhoids were  medium-sized. Complications:            No immediate complications. Estimated Blood Loss:     Estimated blood loss was minimal. Impression:               - One 10 mm polyp in the cecum, removed with a cold                            snare. Resected and retrieved.                           - Non-bleeding external and internal hemorrhoids. Recommendation:           - Resume previous diet.                           - Continue present medications.                           - Await pathology results.                           - Repeat colonoscopy in 3 - 10 years for                            surveillance based on pathology results. Julie Boyd V. Julie Settle, MD 01/06/2024 4:01:39 PM This report has been signed electronically.

## 2024-01-06 NOTE — Progress Notes (Unsigned)
 [facility] History and Physical   Primary Care Physician:  Purcell Emil Schanz, MD   Reason for Procedure:  Colorectal cancer screening  Plan:    Screening colonoscopy with possible interventions as needed     HPI: Julie Boyd is a very pleasant 48 y.o. female here for screening colonoscopy. Denies any nausea, vomiting, abdominal pain, melena or bright red blood per rectum  The risks and benefits as well as alternatives of endoscopic procedure(s) have been discussed and reviewed. All questions answered. The patient agrees to proceed.    Past Medical History:  Diagnosis Date   Allergy    Elderly multigravida 11/29/2013   First trimester bleeding 11/29/2013   HSV-1 infection    Medical history non-contributory    Vaginal delivery 11/29/2013    Past Surgical History:  Procedure Laterality Date   Polyp removal     uterine polyp    Prior to Admission medications   Medication Sig Start Date End Date Taking? Authorizing Provider  FIBER PO Take by mouth.   Yes [provider]  valACYclovir  (VALTREX ) 1000 MG tablet Take 2 tablets (2000 mg) po bid x 24 hours. 09/26/23   Cathlyn JAYSON Nikki Bobie FORBES, MD    Current Outpatient Medications  Medication Sig Dispense Refill   FIBER PO Take by mouth.     valACYclovir  (VALTREX ) 1000 MG tablet Take 2 tablets (2000 mg) po bid x 24 hours. 30 tablet 2   Current Facility-Administered Medications  Medication Dose Route Frequency Provider Last Rate Last Admin   0.9 %  sodium chloride  infusion  500 mL Intravenous Once Ravi Tuccillo V, MD        Allergies as of 01/06/2024 - Review Complete 01/06/2024  Allergen Reaction Noted   Sulfa antibiotics Rash    Aleve [naproxen] Rash 11/29/2013    Family History  Problem Relation Age of Onset   Diabetes Father    Cancer Maternal Grandfather    Asthma Son    Allergic rhinitis Neg Hx    Angioedema Neg Hx    Eczema Neg Hx    Immunodeficiency Neg Hx     Urticaria Neg Hx    Colon polyps Neg Hx    Colon cancer Neg Hx    Esophageal cancer Neg Hx    Rectal cancer Neg Hx    Stomach cancer Neg Hx     Social History   Socioeconomic History   Marital status: Married    Spouse name: Solomon Her   Number of children: 3   Years of education: 12+   Highest education level: Not on file  Occupational History   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   Born in [place]. Came to the [place]  in 1994, at age 32.   Right    Working yes does yard work,    Teacher, early years/pre Strain: Not on [employer] Insecurity: Not on [employer]  Needs: Not on file  Physical Activity: Not on file  Stress: Not on file  Social Connections: Not on file  Intimate Partner Violence: Not on file    Review of Systems:  All other review of systems negative except as mentioned in the HPI.  Physical Exam: Vital signs in last 24 hours: BP 118/74   Pulse (!) 57   Temp 98.1 F (36.7 C) (Temporal)   Ht 5' 1.5 (1.562 m)   Wt 171 lb (77.6 kg)   SpO2 98%   BMI 31.79 kg/m  General:   Alert, NAD Lungs:  Clear .   Heart:  Regular rate and rhythm Abdomen:  Soft, nontender and nondistended. Neuro/Psych:  Alert and cooperative. Normal mood and affect. A and O x 3  Reviewed labs, radiology imaging, old records and pertinent past GI work up  Patient is appropriate for planned procedure(s) and anesthesia in an ambulatory setting   K. Julie Boyd ,  MD [phone]

## 2024-01-06 NOTE — Progress Notes (Unsigned)
 Report given to PACU, vss

## 2024-01-06 NOTE — Progress Notes (Unsigned)
 Called to room to assist during endoscopic procedure.  Patient ID and intended procedure confirmed with present staff. Received instructions for my participation in the procedure from the performing physician.

## 2024-01-06 NOTE — Patient Instructions (Addendum)
 Resume previous diet  Continue present medications  Await pathology results  Repeat colonoscopy in 3-10 years for surveillance based on pathology results See handouts on hemorrhoids and polyps  YOU HAD AN ENDOSCOPIC PROCEDURE TODAY AT THE [facility]:   Refer to the procedure report that was given to you for any specific questions about what was found during the examination.  If the procedure report does not answer your questions, please call your gastroenterologist to clarify.  If you requested that your care partner not be given the details of your procedure findings, then the procedure report has been included in a sealed envelope for you to review at your convenience later.  YOU SHOULD EXPECT: Some feelings of bloating in the abdomen. Passage of more gas than usual.  Walking can help get rid of the air that was put into your GI tract during the procedure and reduce the bloating. If you had a lower endoscopy (such as a colonoscopy or flexible sigmoidoscopy) you may notice spotting of blood in your stool or on the toilet paper. If you underwent a bowel prep for your procedure, you may not have a normal bowel movement for a few days.  Please Note:  You might notice some irritation and congestion in your nose or some drainage.  This is from the oxygen used during your procedure.  There is no need for concern and it should clear up in a day or so.  SYMPTOMS TO REPORT IMMEDIATELY: Following lower endoscopy (colonoscopy or flexible sigmoidoscopy):  Excessive amounts of blood in the stool  Significant tenderness or worsening of abdominal pains  Swelling of the abdomen that is new, acute  Fever of 100F or higher  For urgent or emergent issues, a gastroenterologist can be reached at any hour by calling [phone]. Do not use MyChart messaging for urgent concerns.   DIET:  We do recommend a small meal at first, but then you may proceed to your regular diet.  Drink plenty of fluids but  you should avoid alcoholic beverages for 24 hours.  ACTIVITY:  You should plan to take it easy for the rest of today and you should NOT DRIVE or use heavy machinery until tomorrow (because of the sedation medicines used during the test).    FOLLOW UP: Our staff will call the number listed on your records the next business day following your procedure.  We will call around 7:15- 8:00 am to check on you and address any questions or concerns that you may have regarding the information given to you following your procedure. If we do not reach you, we will leave a message.     If any biopsies were taken you will be contacted by phone or by letter within the next 1-3 weeks.  Please call us  at [phone] if you have not heard about the biopsies in 3 weeks.   SIGNATURES/CONFIDENTIALITY: You and/or your care partner have signed paperwork which will be entered into your electronic medical record.  These signatures attest to the fact that that the information above on your After Visit Summary has been reviewed and is understood.  Full responsibility of the confidentiality of this discharge information lies with you and/or your care-partner.

## 2024-01-07 ENCOUNTER — Telehealth: Payer: Self-pay

## 2024-01-07 NOTE — Telephone Encounter (Signed)
 No answer after follow up call. Voice message left.

## 2024-01-09 LAB — SURGICAL PATHOLOGY

## 2024-01-15 ENCOUNTER — Ambulatory Visit (INDEPENDENT_AMBULATORY_CARE_PROVIDER_SITE_OTHER): Admitting: Obstetrics and Gynecology

## 2024-01-15 ENCOUNTER — Other Ambulatory Visit (HOSPITAL_COMMUNITY)
Admission: RE | Admit: 2024-01-15 | Discharge: 2024-01-15 | Disposition: A | Discharge status: Home / Self Care | Source: Other Acute Inpatient Hospital | Attending: Obstetrics and Gynecology | Admitting: Obstetrics and Gynecology

## 2024-01-15 ENCOUNTER — Encounter: Payer: Self-pay | Admitting: Obstetrics and Gynecology

## 2024-01-15 VITALS — BP 123/83 | HR 66 | Ht 60.5 in

## 2024-01-15 DIAGNOSIS — N393 Stress incontinence (female) (male): Secondary | ICD-10-CM | POA: Diagnosis not present

## 2024-01-15 DIAGNOSIS — R319 Hematuria, unspecified: Secondary | ICD-10-CM

## 2024-01-15 DIAGNOSIS — R82998 Other abnormal findings in urine: Secondary | ICD-10-CM

## 2024-01-15 DIAGNOSIS — N8110 Cystocele, unspecified: Secondary | ICD-10-CM | POA: Insufficient documentation

## 2024-01-15 DIAGNOSIS — N3281 Overactive bladder: Secondary | ICD-10-CM | POA: Insufficient documentation

## 2024-01-15 DIAGNOSIS — R350 Frequency of micturition: Secondary | ICD-10-CM | POA: Diagnosis not present

## 2024-01-15 LAB — POCT URINALYSIS DIP (CLINITEK)
Bilirubin, UA: NEGATIVE
Glucose, UA: NEGATIVE mg/dL
Ketones, POC UA: NEGATIVE mg/dL
Leukocytes, UA: NEGATIVE
Nitrite, UA: NEGATIVE
POC PROTEIN,UA: NEGATIVE
Spec Grav, UA: 1.015 (ref 1.010–1.025)
Urobilinogen, UA: 0.2 U/dL
pH, UA: 6 (ref 5.0–8.0)

## 2024-01-15 MED ORDER — MIRABEGRON ER 25 MG PO TB24: 25.0000 mg | ORAL_TABLET | Freq: Every day | ORAL | 5 refills | Status: DC

## 2024-01-15 NOTE — Progress Notes (Signed)
 New Patient Evaluation and Consultation  Referring Provider: Cathlyn JAYSON Nikki Bobie* PCP: Purcell Emil Schanz, MD Date of Service: 01/15/2024  SUBJECTIVE Chief Complaint: New Patient (Initial Visit) Julie Boyd is a 48 y.o. female s here for SUI)  History of Present Illness: Julie Boyd is a 48 y.o. hispanic female seen in consultation at the request of Dr Cathlyn JAYSON Nikki for evaluation of incontinence.     Urinary Symptoms: Leaks urine with cough/ sneeze, exercise, lifting, during sex, and with urgency Urgency is rare during the day, but becoming more prominant. More often at night.  Started with her last pregnancy but then continued to have issues.  Leaks 1-5 time(s) per day.  Pad use: 2-3 liners/ mini-pads per day.   Patient is bothered by UI symptoms.  Day time voids 5-8.  Nocturia: 2-4 times per night to void. Voiding dysfunction:  does not empty bladder well.  Patient does not use a catheter to empty bladder.  When urinating, patient feels dribbling after finishing, the need to urinate multiple times in a row, and to push on her belly or vagina to empty bladder Drinks: 1 cup coffee, soda with lunch or dinner, 3-4 16oz bottles per day  UTIs: 1 UTI's in the last year.   Denies history of blood in urine and kidney or bladder stones  Pelvic Organ Prolapse Symptoms:                  Patient Denies a feeling of a bulge the vaginal area but was told by her GYN that there is a protrusion This bulge is bothersome.- feels something during sex only  Bowel Symptom: Bowel movements: 1-2 time(s) per day Stool consistency: loose Straining: yes.  Splinting: no.  Incomplete evacuation: no.  Patient Denies accidental bowel leakage / fecal incontinence Bowel regimen: fiber daily  HM Colonoscopy          Upcoming     Colonoscopy (Every 10 Years) Next due on 01/05/2034    01/06/2024  COLONOSCOPY   Only the first 1 history entries have been loaded, but more  history exists.                Sexual Function Sexually active: yes.  Sexual orientation: heterosexual Pain with sex: has discomfort due to prolapse  Pelvic Pain Denies pelvic pain   Past Medical History:  Past Medical History:  Diagnosis Date   Allergy    Elderly multigravida 11/29/2013   First trimester bleeding 11/29/2013   HSV-1 infection    Medical history non-contributory    Vaginal delivery 11/29/2013     Past Surgical History:   Past Surgical History:  Procedure Laterality Date   Polyp removal     uterine polyp     Past OB/GYN History: OB History  Gravida Para Term Preterm AB Living  3 3 3   3   SAB IAB Ectopic Multiple Live Births      3    # Outcome Date GA Lbr Len/2nd Weight Sex Type Anes PTL Lv  3 Term 11/29/13 [redacted]w[redacted]d 05:54 / 00:06 7 lb 0.5 oz (3.19 kg) M Vag-Spont None  LIV  2 Term 12/16/05    M Vag-Spont EPI  LIV  1 Term 12/25/01    M Vag-Spont EPI  LIV   Has had a few irregular periods recently Contraception: none.    Component Value Date/Time   DIAGPAP  06/06/2022 1306    - Negative for intraepithelial lesion or malignancy (NILM)   HPVHIGH Negative  06/06/2022 1306   ADEQPAP  06/06/2022 1306    Satisfactory for evaluation; transformation zone component PRESENT.    Medications: Patient has a current medication list which includes the following prescription(s): mirabegron  er, fiber, and valacyclovir .   Allergies: Patient is allergic to sulfa antibiotics and aleve [naproxen].   Social History:  Social History   Tobacco Use   Smoking status: Never    Passive exposure: Current   Smokeless tobacco: Never  Vaping Use   Vaping status: Never Used  Substance Use Topics   Alcohol use: No    Alcohol/week: 0.0 standard drinks of alcohol   Drug use: No    Relationship status: married Patient lives with her husband.   Patient is not employed . Regular exercise: Yes: walking, jogging, swimming History of abuse: No  Family History:    Family History  Problem Relation Age of Onset   Diabetes Father    Cancer Maternal Grandfather    Asthma Son    Allergic rhinitis Neg Hx    Angioedema Neg Hx    Eczema Neg Hx    Immunodeficiency Neg Hx    Urticaria Neg Hx    Colon polyps Neg Hx    Colon cancer Neg Hx    Esophageal cancer Neg Hx    Rectal cancer Neg Hx    Stomach cancer Neg Hx      Review of Systems: Review of Systems  Constitutional:  Negative for fever, malaise/fatigue and weight loss.  Respiratory:  Negative for cough, shortness of breath and wheezing.   Cardiovascular:  Positive for palpitations. Negative for chest pain and leg swelling.  Gastrointestinal:  Negative for abdominal pain and blood in stool.  Genitourinary:  Negative for dysuria.  Musculoskeletal:  Negative for myalgias.  Skin:  Negative for rash.  Neurological:  Negative for dizziness and headaches.  Endo/Heme/Allergies:  Does not bruise/bleed easily.  Psychiatric/Behavioral:  Negative for depression. The patient is not nervous/anxious.      OBJECTIVE Physical Exam: Vitals:   01/15/24 1103  BP: 123/83  Pulse: 66  Height: 5' 0.5 (1.537 m)    Physical Exam Vitals reviewed. Exam conducted with a chaperone present.  Constitutional:      General: She is not in acute distress. Pulmonary:     Effort: Pulmonary effort is normal.  Abdominal:     General: There is no distension.     Palpations: Abdomen is soft.     Tenderness: There is no abdominal tenderness. There is no rebound.  Musculoskeletal:        General: No swelling. Normal range of motion.  Skin:    General: Skin is warm and dry.     Findings: No rash.  Neurological:     Mental Status: She is alert and oriented to person, place, and time.  Psychiatric:        Mood and Affect: Mood normal.        Behavior: Behavior normal.      GU / Detailed Urogynecologic Evaluation:  Pelvic Exam: Normal external female genitalia; Bartholin's and Skene's glands normal in  appearance; urethral meatus normal in appearance, no urethral masses or discharge.   CST: positive- large leakage with cough and valsalva  Speculum exam reveals normal vaginal mucosa without atrophy. Cervix normal appearance. Uterus normal single, nontender. Adnexa no mass, fullness, tenderness.    Pelvic floor strength II/V, puborectalis III/V external anal sphincter IV/V  Pelvic floor musculature: Right levator non-tender, Right obturator non-tender, Left levator non-tender, Left obturator non-tender  POP-Q:   POP-Q  -1                                            Aa   -1                                           Ba  -7                                              C   4                                            Gh  6                                            Pb  7                                            tvl   -2.5                                            Ap  -2.5                                            Bp  -6                                              D      Rectal Exam:  Normal sphincter tone, no distal rectocele, enterocoele not present, no rectal masses, no sign of dyssynergia when asking the patient to bear down.  Post-Void Residual (PVR) by Bladder Scan: In order to evaluate bladder emptying, we discussed obtaining a postvoid residual and patient agreed to this procedure.  Procedure: The ultrasound unit was placed on the patient's abdomen in the suprapubic region after the patient had voided.    Post Void Residual - 01/15/24 1127       Post Void Residual   Post Void Residual 13 mL           Laboratory Results: Lab Results  Component Value Date   COLORU yellow 01/15/2024   CLARITYU clear 01/15/2024   GLUCOSEUR negative 01/15/2024   BILIRUBINUR negative 01/15/2024   SPECGRAV 1.015 01/15/2024   RBCUR trace-intact (A) 01/15/2024   PHUR 6.0 01/15/2024   PROTEINUR =100 (A) 06/21/2023   UROBILINOGEN 0.2 01/15/2024   LEUKOCYTESUR Negative  01/15/2024    Lab  Results  Component Value Date   CREATININE 0.63 07/15/2023   CREATININE 0.66 07/12/2022   CREATININE 0.71 07/11/2021    Lab Results  Component Value Date   HGBA1C 5.1 07/15/2023    Lab Results  Component Value Date   HGB 13.8 07/15/2023     ASSESSMENT AND PLAN Julie Boyd is a 48 y.o. with:  1. SUI (stress urinary incontinence, female)   2. Overactive bladder   3. Urinary frequency   4. Prolapse of anterior vaginal wall   5. Leukocytes in urine   6. Hematuria, unspecified type     SUI (stress urinary incontinence, female) Assessment & Plan: - For treatment of stress urinary incontinence,  non-surgical options include expectant management, weight loss, physical therapy, as well as a pessary.  Surgical options include a midurethral sling,  and transurethral injection of a bulking agent. - She is interested in pelvic PT, referral placed. May consider a sling in the future.   Orders: -     AMB referral to rehabilitation  Overactive bladder Assessment & Plan: - We discussed the symptoms of overactive bladder (OAB), which include urinary urgency, urinary frequency, nocturia, with or without urge incontinence.  While we do not know the exact etiology of OAB, several treatment options exist. We discussed management including behavioral therapy (decreasing bladder irritants, urge suppression strategies, timed voids, bladder retraining), physical therapy, medication; for refractory cases posterior tibial nerve stimulation, sacral neuromodulation, and intravesical botulinum toxin injection.  - Myrbetriq  25mg  ordered. For Beta-3 agonist medication, we discussed the potential side effect of elevated blood pressure which is more likely to occur in individuals with uncontrolled hypertension. - recommended decreasing coffee and soda intake   Orders: -     Mirabegron  ER; Take 1 tablet (25 mg total) by mouth daily.  Dispense: 30 tablet; Refill: 5 -     AMB  referral to rehabilitation  Urinary frequency -     POCT URINALYSIS DIP (CLINITEK)  Prolapse of anterior vaginal wall Assessment & Plan: Stage II anterior, Stage I posterior, Stage 0 apical prolapse - For treatment of pelvic organ prolapse, we discussed options for management including expectant management, conservative management, and surgical management, such as Kegels, a pessary, pelvic floor physical therapy, and specific surgical procedures. - She is not very symptomatic but this is bothersome with intercourse. She will start with pelvic PT. Would recommend concurrent anterior repair if she is interested in a sling. No apical descent.  - More posterior prolapse noted on prior exam by Dr Nikki so if she does want to pursue surgery then will plan to reexamine.    Leukocytes in urine -     Urine Culture; Future  Hematuria, unspecified type -     Urine Microscopic; Future  Return 6 weeks    Julie LOISE Caper, MD

## 2024-01-15 NOTE — Assessment & Plan Note (Signed)
-   For treatment of stress urinary incontinence,  non-surgical options include expectant management, weight loss, physical therapy, as well as a pessary.  Surgical options include a midurethral sling,  and transurethral injection of a bulking agent. - She is interested in pelvic PT, referral placed. May consider a sling in the future.

## 2024-01-15 NOTE — Patient Instructions (Signed)

## 2024-01-15 NOTE — Assessment & Plan Note (Signed)
 Stage II anterior, Stage I posterior, Stage 0 apical prolapse - For treatment of pelvic organ prolapse, we discussed options for management including expectant management, conservative management, and surgical management, such as Kegels, a pessary, pelvic floor physical therapy, and specific surgical procedures. - She is not very symptomatic but this is bothersome with intercourse. She will start with pelvic PT. Would recommend concurrent anterior repair if she is interested in a sling. No apical descent.  - More posterior prolapse noted on prior exam by Dr Nikki so if she does want to pursue surgery then will plan to reexamine.

## 2024-01-15 NOTE — Assessment & Plan Note (Signed)
-   We discussed the symptoms of overactive bladder (OAB), which include urinary urgency, urinary frequency, nocturia, with or without urge incontinence.  While we do not know the exact etiology of OAB, several treatment options exist. We discussed management including behavioral therapy (decreasing bladder irritants, urge suppression strategies, timed voids, bladder retraining), physical therapy, medication; for refractory cases posterior tibial nerve stimulation, sacral neuromodulation, and intravesical botulinum toxin injection.  - Myrbetriq  25mg  ordered. For Beta-3 agonist medication, we discussed the potential side effect of elevated blood pressure which is more likely to occur in individuals with uncontrolled hypertension. - recommended decreasing coffee and soda intake

## 2024-01-16 ENCOUNTER — Encounter: Payer: Self-pay | Admitting: Gastroenterology

## 2024-01-16 LAB — URINE CULTURE: Culture: <10000 — AB

## 2024-01-30 ENCOUNTER — Telehealth: Payer: Self-pay | Admitting: Gastroenterology

## 2024-01-30 NOTE — Telephone Encounter (Signed)
 Received call from patient stating she hasn't received a fu call from her last inquiry for nurse fu, is requesting advice on colonoscopy results, and in depth discussion. Please review and advise  Thank you

## 2024-01-30 NOTE — Telephone Encounter (Signed)
 Called the patient. No answer. Left her a message that her patient advise request and phone call have been received. She will be contacted with her results after the physician has reviewed them.

## 2024-01-30 NOTE — Telephone Encounter (Signed)
 Pathology results are in EPIC. Patient is calling about that and her patient advise request.

## 2024-02-05 NOTE — Telephone Encounter (Signed)
 I was able to reach patient, discussed results.  She has been having on and off symptoms with hemorrhoids, currently her symptoms have improved.  Advised her to call back if she develops recurrent symptoms from hemorrhoids to consider hemorrhoid band ligation.  Thank you

## 2024-02-18 ENCOUNTER — Telehealth: Payer: Self-pay

## 2024-02-18 NOTE — Telephone Encounter (Signed)
 I 've sent a PA into her insurance and now awaiting response.

## 2024-02-18 NOTE — Telephone Encounter (Signed)
 Patient called wanting to know the status of her medication. States she has an appointment coming up on Oct 8th but is thinking of cancelling since she has not heard back in regards to her medication authorization. Patient says she spoke with someone a few days after her appointment and was advised she would get a call back with further instruction on how much the cost would be  and or if it was approved.   Please advise

## 2024-02-26 ENCOUNTER — Ambulatory Visit: Admitting: Obstetrics and Gynecology

## 2024-02-26 NOTE — Telephone Encounter (Signed)
 Mirabegron  has been denied due to not trying any of the formulary (preferred) medication.

## 2024-03-03 ENCOUNTER — Ambulatory Visit: Admitting: Neurology

## 2024-03-18 ENCOUNTER — Other Ambulatory Visit: Payer: Self-pay | Admitting: Medical Genetics

## 2024-03-18 ENCOUNTER — Ambulatory Visit: Payer: Self-pay | Admitting: Gastroenterology

## 2024-03-19 ENCOUNTER — Other Ambulatory Visit: Payer: Self-pay

## 2024-03-19 ENCOUNTER — Encounter: Payer: Self-pay | Admitting: Physical Therapy

## 2024-03-19 ENCOUNTER — Encounter: Attending: Obstetrics and Gynecology | Admitting: Physical Therapy

## 2024-03-19 DIAGNOSIS — R278 Other lack of coordination: Secondary | ICD-10-CM | POA: Insufficient documentation

## 2024-03-19 DIAGNOSIS — R3915 Urgency of urination: Secondary | ICD-10-CM | POA: Insufficient documentation

## 2024-03-19 NOTE — Therapy (Signed)
 OUTPATIENT PHYSICAL THERAPY FEMALE PELVIC EVALUATION   Patient Name: Julie Boyd MRN: [id number] DOB:09/06/1975, 48 y.o., female Today's Date: 03/19/2024  END OF SESSION:  PT End of Session - 03/19/24 0937     Visit Number 1    Date for Recertification  09/17/24    Authorization Type [place] [employer]    Authorization Time Period Auth required after 27 visits    Authorization - Number of Visits 5    Progress Note Due on Visit 27    PT Start Time 0930    PT Stop Time 1015    PT Time Calculation (min) 45 min    Activity Tolerance Patient tolerated treatment well    Behavior During Therapy [facility] for tasks assessed/performed          Past Medical History:  Diagnosis Date   Allergy    Elderly multigravida 11/29/2013   First trimester bleeding 11/29/2013   HSV-1 infection    Medical history non-contributory    Vaginal delivery 11/29/2013   Past Surgical History:  Procedure Laterality Date   Polyp removal     uterine polyp   Patient Active Problem List   Diagnosis Date Noted   SUI (stress urinary incontinence, female) 01/15/2024   Overactive bladder 01/15/2024   Prolapse of anterior vaginal wall 01/15/2024   Adrenal mass 10/25/2023   Abnormal MRI, lumbar spine 10/25/2023   Bilateral leg paresthesia 10/02/2023   Palpitations 07/15/2023   Patellofemoral arthritis of left knee 08/24/2022   ANA positive 07/16/2022   Arthralgia of multiple joints 07/16/2022   Seasonal and perennial allergic rhinitis 12/12/2016   Pollen-food allergy syndrome, subsequent encounter 12/12/2016   Spondylolisthesis at L5-S1 level 07/14/2015   BMI 30.0-30.9,adult 05/06/2015    PCP: [facility] Dara Schanz, MD  REFERRING PROVIDER: Marilynne Rosaline SAILOR, MD   REFERRING DIAG:  N39.3 (ICD-10-CM) - SUI (stress urinary incontinence, female)  N32.81 (ICD-10-CM) - Overactive bladder    THERAPY DIAG:  Other lack of coordination - Plan: PT plan of care cert/re-cert  Urinary  urgency - Plan: PT plan of care cert/re-cert  Rationale for Evaluation and Treatment: Rehabilitation  ONSET DATE: 2015  SUBJECTIVE:                                                                                                                                                                                           SUBJECTIVE STATEMENT: Patient started to have issues with bladder in 2015.  Fluid intake: 1 cup coffee, soda with lunch or dinner, 3-4 16oz bottles per day   FUNCTIONAL LIMITATIONS: urgency  PERTINENT HISTORY:  Medications for current condition: none Surgeries: See above Other: See  above Sexual abuse: No  PAIN:  Are you having pain? Yes NPRS scale: 8/10 Pain location: suprapubic area  Pain type: pressure Pain description: intermittent   Aggravating factors: longer she holds her urine Relieving factors: release the urine  PRECAUTIONS: None  RED FLAGS: None   WEIGHT BEARING RESTRICTIONS: No  FALLS:  Has patient fallen in last 6 months? No  OCCUPATION: self employed, stay at home mom  ACTIVITY LEVEL : jogging  4-6 miles  PLOF: Independent  PATIENT GOALS: not have the urgency to urinate   BOWEL MOVEMENT: Pain with bowel movement: No Type of bowel movement:Type (Bristol Stool Scale) Type 6, Frequency daily, Strain no, and Splinting no Fully empty rectum: Yes:   Leakage: No                                                   Pads: No Fiber supplement/laxative metamucil nightly  URINATION: Pain with urination: No Fully empty bladder: NoWhen urinating, patient feels dribbling after finishing, the need to urinate multiple times in a row, and to push on her belly or vagina to empty bladder              Stream: Strong Urgency: Yes most on Sundays or places she cannot go to the bathroom Frequency:during the day  voids 5-8                                                          Nocturia: Yes: goes to bed then has to urinate again2-4   Leakage: Urge to  void, Coughing, Sneezing, Exercise, Lifting, and Intercourse running, jumping; Leaks 1-5 time(s) per day.  Pads/briefs: use: 2-3 liners/ mini-pads per day.    INTERCOURSE:  Ability to have vaginal penetration Yes  Pain with intercourse: none Dryness: No Climax: no Marinoff Scale: 0/3 Lubricant:; no  PREGNANCY: Vaginal deliveries 3 Tearing No Episiotomy No C-section deliveries 0 Currently pregnant No  PROLAPSE: Stage II anterior, Stage I posterior, Stage 0 apical prolapse    OBJECTIVE:  Note: Objective measures were completed at Evaluation unless otherwise noted.  DIAGNOSTIC FINDINGS:  CST: positive- large leakage with cough and valsalva  Post Void Residual 13 mL     COGNITION: Overall cognitive status: Within functional limits for tasks assessed     SENSATION: Light touch: Appears intact  FUNCTIONAL TESTS:  Single leg stance:  Rt:no issues  Lt:no issues Squat:good squat  GAIT: Assistive device utilized: None   POSTURE: No Significant postural limitations   LUMBARAROM/PROM: lumbar ROM is full   LOWER EXTREMITY MNF:apojuzmjo hip ROM is WFL   LOWER EXTREMITY MMT:  MMT Right eval Left eval  Hip extension 4/5 4/5  Hip abduction 4/5 4/5   (Blank rows = not tested) PALPATION: Pelvic Alignment: ASIS are equal  Abdominal:   Diastasis: No Distortion: No  Breathing: upper chest breather; difficulty with opening the lower rib cage Scar tissue: No                External Perineal Exam: good coloring of the vulva area  Internal Pelvic Floor: No trigger points or tightness in the pelvic floor  Patient confirms identification and approves PT to assess internal pelvic floor and treatment Yes No emotional/communication barriers or cognitive limitation. Patient is motivated to learn. Patient understands and agrees with treatment goals and plan. PT explains patient will be examined in standing, sitting, and lying down to see how  their muscles and joints work. When they are ready, they will be asked to remove their underwear so PT can examine their perineum. The patient is also given the option of providing their own chaperone as one is not provided in our facility. The patient also has the right and is explained the right to defer or refuse any part of the evaluation or treatment including the internal exam. With the patient's consent, PT will use one gloved finger to gently assess the muscles of the pelvic floor, seeing how well it contracts and relaxes and if there is muscle symmetry. After, the patient will get dressed and PT and patient will discuss exam findings and plan of care. PT and patient discuss plan of care, schedule, attendance policy and HEP activities.   PELVIC MMT:   MMT eval  Vaginal 3/5 3 sec supine; standing 2/5  (Blank rows = not tested)        TONE: Good tone of pelvic floor  PROLAPSE: Anterior wall just above the introitus  TODAY'S TREATMENT:                                                                                                                              DATE: 03/19/24  EVAL Examination completed, findings reviewed, pt educated on POC, HEP, and female pelvic floor anatomy, reasoning with pelvic floor assessment internally with pt consent. Pt motivated to participate in PT and agreeable to attempt recommendations.     PATIENT EDUCATION:  03/19/24 Education details: Access Code: [email or web address] Person educated: Patient Education method: Explanation, Demonstration, Actor cues, Verbal cues, and Handouts Education comprehension: verbalized understanding, returned demonstration, verbal cues required, tactile cues required, and needs further education  HOME EXERCISE PROGRAM: 03/19/24 Access Code: [email or web address] URL: [email or web address] Date: 03/19/2024 Prepared by: Channing Pereyra  Exercises - Supine Diaphragmatic Breathing  - 1 x daily - 7 x weekly - 1 sets - 10 reps - Seated  Diaphragmatic Breathing  - 1 x daily - 7 x weekly - 1 sets - 10 reps  Patient Education - Urinary Urge Control Techniques  For all possible CPT codes, reference the Planned Interventions line above.     Check all conditions that are expected to impact treatment: {Conditions expected to impact treatment:None of these apply   If treatment provided at initial evaluation, no treatment charged due to lack of authorization.       ASSESSMENT:  CLINICAL IMPRESSION: Patient is a 48 y.o. female who was seen today for physical therapy evaluation and treatment for Stress incontinence and overactive bladder. Patient reports her pressure in the bladder as  the bladder gets fuller is 8/10. Patient has  urgency during the day and at night. She will press on her abdomen to fully empty her bladder.  She leaks urine with urge to void, coughing, sneezing, exercise, lifting, intercourse, running, and jumping. Patient  leaks 1-5 time(s) per day. She uses 2-3 liners/ mini-pads per day. Pelvic floor strength is 3/5 for 3 sec in supine and 2/5 for 3 sec in standing. She will wake up 2-4 times per night to urinate. She has urinary urgency on Sundays when she is at church or when she has to go someplace and not able to use the bathroom. Patient will benefit from skilled therapy to reduce urgency to improve quality of life.   OBJECTIVE IMPAIRMENTS: decreased coordination, decreased strength, increased fascial restrictions, and pain.   ACTIVITY LIMITATIONS: lifting, continence, and toileting  PARTICIPATION LIMITATIONS: shopping and community activity  PERSONAL FACTORS: Time since onset of injury/illness/exacerbation are also affecting patient's functional outcome.   REHAB POTENTIAL: Excellent  CLINICAL DECISION MAKING: Stable/uncomplicated  EVALUATION COMPLEXITY: Low   GOALS: Goals reviewed with patient? Yes  SHORT TERM GOALS: Target date: 04/16/24  Patient independent with diaphragmatic breathing to  lengthen the pelvic floor.  Baseline:not educated yet Goal status: INITIAL  2.  Patient understands how to sit on the commode to fully empty her bladder and not have to push on her abdomen to urinate.  Baseline: not educated yet Goal status: INITIAL  3.  Patient educated on the urgency drill to reduce the urge to void.  Baseline: not educated yet Goal status: INITIAL   LONG TERM GOALS: Target date: 09/18/23  Patient independent with advanced HEP to reduce urinary urgency, increase pelvic floor strength to reduce leakage.  Baseline: Not educated yet Goal status: INITIAL  2.  Patient educated on ways to reduce pelvic floor pressure on her prolapse and the progression.   Baseline: not educated yet Goal status: INITIAL  3.  Patient reports she wakes up 1 time per night due to the ability to deter the urge to urinate.  Baseline: wakes up 4 times Goal status: INITIAL  4.  Patient is able to go to church with urinary urgency reduce greater than 75% so she is able to stand at the choir and not leave.  Baseline: has urgency when going to church.  Goal status: INITIAL   PLAN:  PT FREQUENCY: 1x/week  PT DURATION: 6 months  PLANNED INTERVENTIONS: 97110-Therapeutic exercises, 97530- Therapeutic activity, 97112- Neuromuscular re-education, 97535- Self Care, [id number]- Manual therapy, G0283- Electrical stimulation (unattended), Patient/Family education, Joint mobilization, Spinal mobilization, Cryotherapy, Moist heat, and Biofeedback  PLAN FOR NEXT SESSION: manual work to lower abdomen, urge to void, diaphragmatic breathing, pelvic floor strength   Channing Pereyra, PT 03/19/24 11:33 AM

## 2024-03-26 ENCOUNTER — Ambulatory Visit: Admitting: "Endocrinology

## 2024-04-01 ENCOUNTER — Encounter: Payer: Self-pay | Admitting: Obstetrics and Gynecology

## 2024-04-01 ENCOUNTER — Ambulatory Visit (INDEPENDENT_AMBULATORY_CARE_PROVIDER_SITE_OTHER): Admitting: Obstetrics and Gynecology

## 2024-04-01 VITALS — BP 113/73 | HR 62

## 2024-04-01 DIAGNOSIS — N8110 Cystocele, unspecified: Secondary | ICD-10-CM | POA: Diagnosis not present

## 2024-04-01 DIAGNOSIS — N3281 Overactive bladder: Secondary | ICD-10-CM | POA: Diagnosis not present

## 2024-04-01 DIAGNOSIS — N393 Stress incontinence (female) (male): Secondary | ICD-10-CM

## 2024-04-01 MED ORDER — TROSPIUM CHLORIDE ER 60 MG PO CP24: 1.0000 | ORAL_CAPSULE | Freq: Every day | ORAL | 5 refills | Status: DC

## 2024-04-01 NOTE — Progress Notes (Signed)
 [facility] Return Visit  SUBJECTIVE  History of Present Illness: Julie Boyd is a 48 y.o. female seen in follow-up for mixed incontinence, prolapse. She had her initial visit at pelvic physical therapy.   We were unable to get authorization for myrbetriq .    Past Medical History: Patient  has a past medical history of Allergy, Elderly multigravida (11/29/2013), First trimester bleeding (11/29/2013), HSV-1 infection, Medical history non-contributory, and Vaginal delivery (11/29/2013).   Past Surgical History: She  has a past surgical history that includes Polyp removal.   Medications: She has a current medication list which includes the following prescription(s): fiber, trospium chloride, and valacyclovir .   Allergies: Patient is allergic to sulfa antibiotics and aleve [naproxen].   Social History: Patient  reports that she has never smoked. She has been exposed to tobacco smoke. She has never used smokeless tobacco. She reports that she does not drink alcohol and does not use drugs.     OBJECTIVE     Physical Exam: Vitals:   04/01/24 0807  BP: 113/73  Pulse: 62   Gen: No apparent distress, A&O x 3.  Detailed Urogynecologic Evaluation:  Deferred.    ASSESSMENT AND PLAN    Julie Boyd is a 48 y.o. with:  1. Overactive bladder   2. SUI (stress urinary incontinence, female)   3. Prolapse of anterior vaginal wall    - We discussed continuing with pelvic PT although she is hesitant about what to expect for future visits.  - Ordered Trospium 60mg  ER daily. We discussed possible side effects of dry mouth, dry eyes or constipation. She already has some dry mouth at baseline so recommended Biotene mouth wash. Also recommended daily fiber supplementation to prevent constipation.  - Return 6-8 weeks for follow up  Rosaline LOISE Caper, MD

## 2024-04-02 ENCOUNTER — Encounter: Attending: Obstetrics and Gynecology | Admitting: Physical Therapy

## 2024-04-02 ENCOUNTER — Encounter: Payer: Self-pay | Admitting: Physical Therapy

## 2024-04-02 DIAGNOSIS — R3915 Urgency of urination: Secondary | ICD-10-CM | POA: Diagnosis not present

## 2024-04-02 DIAGNOSIS — R278 Other lack of coordination: Secondary | ICD-10-CM | POA: Insufficient documentation

## 2024-04-02 NOTE — Patient Instructions (Addendum)
 Bladder Irritants  Certain foods and beverages can be irritating to the bladder.  Avoiding these irritants may decrease your symptoms of urinary urgency, frequency or bladder pain.  Even reducing your intake can help with your symptoms.  Not everyone is sensitive to all bladder irritants, so you may consider focusing on one irritant at a time, removing or reducing your intake of that irritant for 7-10 days to see if this change helps your symptoms.  Water intake is also very important.  Below is a list of bladder irritants.  Drinks: alcohol, carbonated beverages, caffeinated beverages such as coffee and tea, drinks with artificial sweeteners, citrus juices, apple juice, tomato juice  Foods: tomatoes and tomato based foods, spicy food, sugar and artificial sweeteners, vinegar, chocolate, raw onion, apples, citrus fruits, pineapple, cranberries, tomatoes, strawberries, plums, peaches, cantaloupe  Other: acidic urine (too concentrated) - see water intake info below  Substitutes you can try that are NOT irritating to the bladder: cooked onion, pears, papayas, sun-brewed decaf teas, watermelons, non-citrus herbal teas, apricots, kava and low-acid instant drinks (Postum).    WATER INTAKE: Remember to drink lots of water (aim for fluid intake of half your body weight with 2/3 of fluids being water).  You may be limiting fluids due to fear of leakage, but this can actually worsen urgency symptoms due to highly concentrated urine.  Water helps balance the pH of your urine so it doesn't become too acidic - acidic urine is a bladder irritant!  Urge Incontinence  Ideal urination frequency is every 2-4 wakeful hours, which equates to 5-8 times within a 24-hour period.   Urge incontinence is leakage that occurs when the bladder muscle contracts, creating a sudden need to go before getting to the bathroom.   Going too often when your bladder isn't actually full can disrupt the body's automatic signals to  store and hold urine longer, which will increase urgency/frequency.  In this case, the bladder "is running the show" and strategies can be learned to retrain this pattern.   One should be able to control the first urge to urinate, at around .  The bladder can hold up to a "grande latte," or . To help you gain control, practice the Urge Drill below when urgency strikes.  This drill will help retrain your bladder signals and allow you to store and hold urine longer.  The overall goal is to stretch out your time between voids to reach a more manageable voiding schedule.    Practice your quick flicks often throughout the day (each waking hour) even when you don't need feel the urge to go.  This will help strengthen your pelvic floor muscles, making them more effective in controlling leakage.  Urge Drill  When you feel an urge to go, follow these steps to regain control: Stop what you are doing and be still Take one deep breath, directing your air into your abdomen Think an affirming thought, such as "I've got this." Do 5 quick flicks of your pelvic floor Walk with control to the bathroom to void, or delay voiding Channing Pereyra, PT [facility] [address] [place], [place] [address] W: [phone] [email or web address]

## 2024-04-02 NOTE — Therapy (Signed)
 OUTPATIENT PHYSICAL THERAPY FEMALE PELVIC TREATMENT   Patient Name: Julie Boyd MRN: [id number] DOB:[birth year], 48 y.o., female Today's Date: 04/02/2024  END OF SESSION:  PT End of Session - 04/02/24 0938     Visit Number 2    Date for Recertification  09/17/24    Authorization Type [place] [employer]    Authorization Time Period Auth required after 27 visits    Authorization - Number of Visits 6    Progress Note Due on Visit 27    PT Start Time 0930    PT Stop Time 1025    PT Time Calculation (min) 55 min    Activity Tolerance Patient tolerated treatment well    Behavior During Therapy [facility] for tasks assessed/performed          Past Medical History:  Diagnosis Date   Allergy    Elderly multigravida 11/29/2013   First trimester bleeding 11/29/2013   HSV-1 infection    Medical history non-contributory    Vaginal delivery 11/29/2013   Past Surgical History:  Procedure Laterality Date   Polyp removal     uterine polyp   Patient Active Problem List   Diagnosis Date Noted   SUI (stress urinary incontinence, female) 01/15/2024   Overactive bladder 01/15/2024   Prolapse of anterior vaginal wall 01/15/2024   Adrenal mass 10/25/2023   Abnormal MRI, lumbar spine 10/25/2023   Bilateral leg paresthesia 10/02/2023   Palpitations 07/15/2023   Patellofemoral arthritis of left knee 08/24/2022   ANA positive 07/16/2022   Arthralgia of multiple joints 07/16/2022   Seasonal and perennial allergic rhinitis 12/12/2016   Pollen-food allergy syndrome, subsequent encounter 12/12/2016   Spondylolisthesis at L5-S1 level 07/14/2015   BMI 30.0-30.9,adult 05/06/2015    PCP: Purcell Dara Schanz, MD  REFERRING PROVIDER: Marilynne Rosaline SAILOR, MD   REFERRING DIAG:  N39.3 (ICD-10-CM) - SUI (stress urinary incontinence, female)  N32.81 (ICD-10-CM) - Overactive bladder    THERAPY DIAG:  Other lack of coordination  Urinary urgency  Rationale for Evaluation and  Treatment: Rehabilitation  ONSET DATE: 2015  SUBJECTIVE:                                                                                                                                                                                           SUBJECTIVE STATEMENT: I am waiting for approval of a new medicine for the overactive bladder.  .  Fluid intake: 1 cup coffee, soda with lunch or dinner, 3-4 16oz bottles per day   FUNCTIONAL LIMITATIONS: urgency  PERTINENT HISTORY:  Medications for current condition: Trospium 60mg  ER daily  Surgeries: See above Other: See above Sexual abuse: No  PAIN:  Are you having pain? Yes NPRS scale: 8/10 Pain location: suprapubic area  Pain type: pressure Pain description: intermittent   Aggravating factors: longer she holds her urine Relieving factors: release the urine  PRECAUTIONS: None  RED FLAGS: None   WEIGHT BEARING RESTRICTIONS: No  FALLS:  Has patient fallen in last 6 months? No  OCCUPATION: self employed, stay at home mom  ACTIVITY LEVEL : jogging  4-6 miles  PLOF: Independent  PATIENT GOALS: not have the urgency to urinate   BOWEL MOVEMENT: Pain with bowel movement: No Type of bowel movement:Type (Bristol Stool Scale) Type 6, Frequency daily, Strain no, and Splinting no Fully empty rectum: Yes:   Leakage: No                                                   Pads: No Fiber supplement/laxative metamucil nightly  URINATION: Pain with urination: No Fully empty bladder: NoWhen urinating, patient feels dribbling after finishing, the need to urinate multiple times in a row, and to push on her belly or vagina to empty bladder              Stream: Strong Urgency: Yes most on Sundays or places she cannot go to the bathroom Frequency:during the day  voids 5-8                                                           Nocturia: Yes: goes to bed then has to urinate again2-4   04/02/24: now wakes up 1 time per night Leakage: Urge  to void, Coughing, Sneezing, Exercise, Lifting, and Intercourse running, jumping; Leaks 1-5 time(s) per day.  Pads/briefs: use: 2-3 liners/ mini-pads per day.    INTERCOURSE:  Ability to have vaginal penetration Yes  Pain with intercourse: none Dryness: No Climax: no Marinoff Scale: 0/3 Lubricant:; no  PREGNANCY: Vaginal deliveries 3 Tearing No Episiotomy No C-section deliveries 0 Currently pregnant No  PROLAPSE: Stage II anterior, Stage I posterior, Stage 0 apical prolapse    OBJECTIVE:  Note: Objective measures were completed at Evaluation unless otherwise noted.  DIAGNOSTIC FINDINGS:  CST: positive- large leakage with cough and valsalva  Post Void Residual 13 mL     COGNITION: Overall cognitive status: Within functional limits for tasks assessed     SENSATION: Light touch: Appears intact  FUNCTIONAL TESTS:  Single leg stance:  Rt:no issues  Lt:no issues Squat:good squat  GAIT: Assistive device utilized: None   POSTURE: No Significant postural limitations   LUMBARAROM/PROM: lumbar ROM is full   LOWER EXTREMITY MNF:apojuzmjo hip ROM is WFL   LOWER EXTREMITY MMT:  MMT Right eval Left eval  Hip extension 4/5 4/5  Hip abduction 4/5 4/5   (Blank rows = not tested) PALPATION: Pelvic Alignment: ASIS are equal  Abdominal:   Diastasis: No Distortion: No  Breathing: upper chest breather; difficulty with opening the lower rib cage Scar tissue: No                External Perineal Exam: good coloring of the vulva area  Internal Pelvic Floor: No trigger points or tightness in the pelvic floor  Patient confirms identification and approves PT to assess internal pelvic floor and treatment Yes No emotional/communication barriers or cognitive limitation. Patient is motivated to learn. Patient understands and agrees with treatment goals and plan. PT explains patient will be examined in standing, sitting, and lying down to see how  their muscles and joints work. When they are ready, they will be asked to remove their underwear so PT can examine their perineum. The patient is also given the option of providing their own chaperone as one is not provided in our facility. The patient also has the right and is explained the right to defer or refuse any part of the evaluation or treatment including the internal exam. With the patient's consent, PT will use one gloved finger to gently assess the muscles of the pelvic floor, seeing how well it contracts and relaxes and if there is muscle symmetry. After, the patient will get dressed and PT and patient will discuss exam findings and plan of care. PT and patient discuss plan of care, schedule, attendance policy and HEP activities.   PELVIC MMT:   MMT eval  Vaginal 3/5 3 sec supine; standing 2/5  (Blank rows = not tested)        TONE: Good tone of pelvic floor  PROLAPSE: Anterior wall just above the introitus  TODAY'S TREATMENT:      04/02/24 Manual: Soft tissue mobilization: Manual work to the lumbar thoracic paraspinals and the gluteus medius to work on tissue mobility Myofascial release: Tissue rolling of the abdomen to release the fascial restrictions Quadruped with pulling the tissue forward from back to the front to release the lateral trunk Using the suction cup to release the tissue on the back Neuromuscular re-education: Core retraining: Transverse abdominus contraction with ball squeeze and tactile cues to engage the abdomen correctly Form correction: Quadruped with hands in the suprapubic area moving back and forth to release the fascia then working on the diaphragm Down training: Educated patient on the urge to void to deter her urinary urgency Diaphragmatic breathing sitting on ball then did in quadruped and supine Self-care: Educated patient about the bladder irritants and how they affect the bladder and gave her a list of them. She is going to try to not  have irritants for 4 days to see if there is a difference.  Educated patient on the different treatment options that her and the doctor discussed that the doctor can offer.                                                                                                                             DATE: 03/19/24  EVAL Examination completed, findings reviewed, pt educated on POC, HEP, and female pelvic floor anatomy, reasoning with pelvic floor assessment internally with pt consent. Pt motivated to participate in PT and agreeable to attempt recommendations.     PATIENT EDUCATION:  03/19/24  Education details: Access Code: [email or web address] Person educated: Patient Education method: Explanation, Demonstration, Tactile cues, Verbal cues, and Handouts Education comprehension: verbalized understanding, returned demonstration, verbal cues required, tactile cues required, and needs further education  HOME EXERCISE PROGRAM: 03/19/24 Access Code: [email or web address] URL: [email or web address] Date: 03/19/2024 Prepared by: Channing Pereyra  Exercises - Supine Diaphragmatic Breathing  - 1 x daily - 7 x weekly - 1 sets - 10 reps - Seated Diaphragmatic Breathing  - 1 x daily - 7 x weekly - 1 sets - 10 reps  Patient Education - Urinary Urge Control Techniques       ASSESSMENT:  CLINICAL IMPRESSION: Patient is a 48 y.o. female who was seen today for physical therapy  treatment for Stress incontinence and overactive bladder. Pateint is waking up 1 time per night now.  She was able to perform diaphragmatic breathing and feel the pelvic floor relax by the end of the treatment. She has difficulty with contracting the rectus muscles. She was able to contract the pelvic floor contraction better after the session. She understands the urge to void for urinary urgency. Patient will benefit from skilled therapy to reduce urgency to improve quality of life.   OBJECTIVE IMPAIRMENTS: decreased coordination, decreased  strength, increased fascial restrictions, and pain.   ACTIVITY LIMITATIONS: lifting, continence, and toileting  PARTICIPATION LIMITATIONS: shopping and community activity  PERSONAL FACTORS: Time since onset of injury/illness/exacerbation are also affecting patient's functional outcome.   REHAB POTENTIAL: Excellent  CLINICAL DECISION MAKING: Stable/uncomplicated  EVALUATION COMPLEXITY: Low   GOALS: Goals reviewed with patient? Yes  SHORT TERM GOALS: Target date: 04/16/24  Patient independent with diaphragmatic breathing to lengthen the pelvic floor.  Baseline:not educated yet Goal status: Met 04/02/24  2.  Patient understands how to sit on the commode to fully empty her bladder and not have to push on her abdomen to urinate.  Baseline: not educated yet Goal status: INITIAL  3.  Patient educated on the urgency drill to reduce the urge to void.  Baseline: not educated yet Goal status: Met 04/02/24   LONG TERM GOALS: Target date: 09/18/23  Patient independent with advanced HEP to reduce urinary urgency, increase pelvic floor strength to reduce leakage.  Baseline: Not educated yet Goal status: INITIAL  2.  Patient educated on ways to reduce pelvic floor pressure on her prolapse and the progression.   Baseline: not educated yet Goal status: INITIAL  3.  Patient reports she wakes up 1 time per night due to the ability to deter the urge to urinate.  Baseline: wakes up 4 times Goal status: INITIAL  4.  Patient is able to go to church with urinary urgency reduce greater than 75% so she is able to stand at the choir and not leave.  Baseline: has urgency when going to church.  Goal status: INITIAL   PLAN:  PT FREQUENCY: 1x/week  PT DURATION: 6 months  PLANNED INTERVENTIONS: 97110-Therapeutic exercises, 97530- Therapeutic activity, 97112- Neuromuscular re-education, 97535- Self Care, [id number]- Manual therapy, [id number]- Electrical stimulation (unattended), Patient/Family  education, Joint mobilization, Spinal mobilization, Cryotherapy, Moist heat, and Biofeedback  PLAN FOR NEXT SESSION: manual work to lower abdomen, review urge to void, see how it went with the bladder irritants, quadruped yoga block, rectus abdominal exercise, pelvic floor strength   Channing Pereyra, PT 04/02/24 10:54 AM

## 2024-04-06 ENCOUNTER — Other Ambulatory Visit (HOSPITAL_COMMUNITY): Payer: Self-pay

## 2024-04-06 ENCOUNTER — Telehealth: Payer: Self-pay | Admitting: Pharmacy Technician

## 2024-04-06 DIAGNOSIS — N3281 Overactive bladder: Secondary | ICD-10-CM

## 2024-04-06 NOTE — Telephone Encounter (Signed)
 Pharmacy Patient Advocate Encounter   Received notification from Patient Pharmacy/Onbase that prior authorization for Trospium Chloride 60mg  is required/requested.   Insurance verification completed.   The patient is insured through [employer].   Per test claim:  Step Therapy: Requires trial of preferred products.  Fesoterodine ER, Oxybutynin (solution/syrup/tablet/ER tablet), solifenacin, tolterodine, or Myrbetriq  ER is preferred by the insurance.  If suggested medication is appropriate, Please send in a new RX and discontinue this one. If not, please advise as to why it's not appropriate so that we may request a Prior Authorization. Please note, some preferred medications may still require a PA.  If the suggested medications have not been trialed and there are no contraindications to their use, the PA will not be submitted, as it will not be approved.

## 2024-04-09 ENCOUNTER — Encounter: Admitting: Physical Therapy

## 2024-04-14 ENCOUNTER — Encounter: Admitting: Physical Therapy

## 2024-04-14 ENCOUNTER — Other Ambulatory Visit (HOSPITAL_COMMUNITY): Payer: Self-pay

## 2024-04-14 MED ORDER — SOLIFENACIN SUCCINATE 5 MG PO TABS: 5.0000 mg | ORAL_TABLET | Freq: Every day | ORAL | 5 refills | Status: DC

## 2024-04-14 NOTE — Telephone Encounter (Signed)
 Ordered solifenacin  5mg  as alternative Rx.

## 2024-04-15 ENCOUNTER — Other Ambulatory Visit: Payer: Self-pay

## 2024-04-20 ENCOUNTER — Other Ambulatory Visit (HOSPITAL_COMMUNITY)

## 2024-04-21 ENCOUNTER — Inpatient Hospital Stay (HOSPITAL_COMMUNITY): Admission: RE | Admit: 2024-04-21 | Discharge: 2024-04-21 | Payer: Self-pay | Attending: Oncology | Admitting: Oncology

## 2024-04-23 ENCOUNTER — Encounter: Admitting: Physical Therapy

## 2024-04-23 ENCOUNTER — Encounter: Payer: Self-pay | Admitting: Physical Therapy

## 2024-04-23 DIAGNOSIS — R3915 Urgency of urination: Secondary | ICD-10-CM | POA: Insufficient documentation

## 2024-04-23 DIAGNOSIS — R278 Other lack of coordination: Secondary | ICD-10-CM | POA: Diagnosis present

## 2024-04-23 NOTE — Therapy (Signed)
 OUTPATIENT PHYSICAL THERAPY FEMALE PELVIC TREATMENT   Patient Name: Julie Boyd MRN: [id number] DOB:July 23, 1975, 48 y.o., female Today's Date: 04/23/2024  END OF SESSION:  PT End of Session - 04/23/24 0941     Visit Number 2    Date for Recertification  09/17/24    Authorization Type [place] [employer]    Authorization Time Period Auth required after 27 visits    Authorization - Number of Visits 7    Progress Note Due on Visit 27    PT Start Time 0940    PT Stop Time 1025    PT Time Calculation (min) 45 min    Activity Tolerance Patient tolerated treatment well    Behavior During Therapy [facility] for tasks assessed/performed          Past Medical History:  Diagnosis Date   Allergy    Elderly multigravida 11/29/2013   First trimester bleeding 11/29/2013   HSV-1 infection    Medical history non-contributory    Vaginal delivery 11/29/2013   Past Surgical History:  Procedure Laterality Date   Polyp removal     uterine polyp   Patient Active Problem List   Diagnosis Date Noted   SUI (stress urinary incontinence, female) 01/15/2024   Overactive bladder 01/15/2024   Prolapse of anterior vaginal wall 01/15/2024   Adrenal mass 10/25/2023   Abnormal MRI, lumbar spine 10/25/2023   Bilateral leg paresthesia 10/02/2023   Palpitations 07/15/2023   Patellofemoral arthritis of left knee 08/24/2022   ANA positive 07/16/2022   Arthralgia of multiple joints 07/16/2022   Seasonal and perennial allergic rhinitis 12/12/2016   Pollen-food allergy syndrome, subsequent encounter 12/12/2016   Spondylolisthesis at L5-S1 level 07/14/2015   BMI 30.0-30.9,adult 05/06/2015    PCP: Purcell Dara Schanz, MD  REFERRING PROVIDER: Marilynne Rosaline SAILOR, MD   REFERRING DIAG:  N39.3 (ICD-10-CM) - SUI (stress urinary incontinence, female)  N32.81 (ICD-10-CM) - Overactive bladder    THERAPY DIAG:  Other lack of coordination  Urinary urgency  Rationale for Evaluation and  Treatment: Rehabilitation  ONSET DATE: 2015  SUBJECTIVE:                                                                                                                                                                                           SUBJECTIVE STATEMENT: I will have to make this my last appointment due to taking care of my son and having to bring him to doctor appointments. I have not picked up the Tropium yet. I went 4 days without coke and coffee and will get up 0-1 times per night. When she gets to a place, she will have to  rush to the bathroom.     FUNCTIONAL LIMITATIONS: urgency  PERTINENT HISTORY:  Medications for current condition: Trospium  60mg  ER daily  Surgeries: See above Other: See above Sexual abuse: No  PAIN:  Are you having pain? Yes NPRS scale: 8/10 04/23/24: 0/10 Pain location: suprapubic area  Pain type: pressure Pain description: intermittent   Aggravating factors: longer she holds her urine Relieving factors: release the urine  PRECAUTIONS: None  RED FLAGS: None   WEIGHT BEARING RESTRICTIONS: No  FALLS:  Has patient fallen in last 6 months? No  OCCUPATION: self employed, stay at home mom  ACTIVITY LEVEL : jogging  4-6 miles  PLOF: Independent  PATIENT GOALS: not have the urgency to urinate   BOWEL MOVEMENT: Pain with bowel movement: No Type of bowel movement:Type (Bristol Stool Scale) Type 6, Frequency daily, Strain no, and Splinting no Fully empty rectum: Yes:   Leakage: No                                                   Pads: No Fiber supplement/laxative metamucil nightly  URINATION: Pain with urination: No Fully empty bladder: NoWhen urinating, patient feels dribbling after finishing, the need to urinate multiple times in a row, and to push on her belly or vagina to empty bladder      After she urinates she feel her bladder has completely emptied.          Stream: Strong Urgency: Yes most on Sundays or places she cannot go  to the bathroom Frequency:during the day  voids 5-8                                                           Nocturia: Yes: goes to bed then has to urinate again2-4   04/02/24: now wakes up 1 time per night Leakage: Urge to void, Coughing, Sneezing, Exercise, Lifting, and Intercourse running, jumping; Leaks 1-5 time(s) per day.  04/23/24: Still leak urine as above and is the same amount.  Pads/briefs: use: 2-3 liners/ mini-pads per day.    INTERCOURSE:  Ability to have vaginal penetration Yes  Pain with intercourse: none Dryness: No Climax: no Marinoff Scale: 0/3 Lubricant:; no  PREGNANCY: Vaginal deliveries 3 Tearing No Episiotomy No C-section deliveries 0 Currently pregnant No  PROLAPSE: Stage II anterior, Stage I posterior, Stage 0 apical prolapse    OBJECTIVE:  Note: Objective measures were completed at Evaluation unless otherwise noted.  DIAGNOSTIC FINDINGS:  CST: positive- large leakage with cough and valsalva  Post Void Residual 13 mL     COGNITION: Overall cognitive status: Within functional limits for tasks assessed     SENSATION: Light touch: Appears intact  FUNCTIONAL TESTS:  Single leg stance:  Rt:no issues  Lt:no issues Squat:good squat  GAIT: Assistive device utilized: None   POSTURE: No Significant postural limitations   LUMBARAROM/PROM: lumbar ROM is full   LOWER EXTREMITY MNF:apojuzmjo hip ROM is WFL   LOWER EXTREMITY MMT:  MMT Right eval Left eval  Hip extension 4/5 4/5  Hip abduction 4/5 4/5   (Blank rows = not tested) PALPATION: Pelvic Alignment: ASIS are equal  Abdominal:   Diastasis:  No Distortion: No  Breathing: upper chest breather; difficulty with opening the lower rib cage Scar tissue: No                External Perineal Exam: good coloring of the vulva area                             Internal Pelvic Floor: No trigger points or tightness in the pelvic floor  Patient confirms identification and approves PT to  assess internal pelvic floor and treatment Yes No emotional/communication barriers or cognitive limitation. Patient is motivated to learn. Patient understands and agrees with treatment goals and plan. PT explains patient will be examined in standing, sitting, and lying down to see how their muscles and joints work. When they are ready, they will be asked to remove their underwear so PT can examine their perineum. The patient is also given the option of providing their own chaperone as one is not provided in our facility. The patient also has the right and is explained the right to defer or refuse any part of the evaluation or treatment including the internal exam. With the patient's consent, PT will use one gloved finger to gently assess the muscles of the pelvic floor, seeing how well it contracts and relaxes and if there is muscle symmetry. After, the patient will get dressed and PT and patient will discuss exam findings and plan of care. PT and patient discuss plan of care, schedule, attendance policy and HEP activities.   PELVIC MMT:   MMT eval  Vaginal 3/5 3 sec supine; standing 2/5  (Blank rows = not tested)        TONE: Good tone of pelvic floor  PROLAPSE: Anterior wall just above the introitus  TODAY'S TREATMENT:      04/23/24 Neuromuscular re-education: Pelvic floor contraction training: Lifting 10# from the floor with pelvic floor contraction, space between the rib cage and pubic bone with breathing out 10 x then did the same with 25# Picking up a 10# from the shoulder height down with core engagement and breath Jump with core and pelvic floor engaged Down training: Sitting on ball with hand under the vagina to feel the pelvic floor relax and for her to connect to it. Took many tactile cues to place air into the abdomen.  Reviewed the urge to void drill and she was able to return demonstration.  Therapeutic activities: Functional strengthening activities: Cough or sneeze with  flexing at the hips and keeping the space between the rib cage and pubic bone.  Self-care: Educated patient on different you tube videos for exercise and therapist gave patient different sites to go to.     04/02/24 Manual: Soft tissue mobilization: Manual work to the lumbar thoracic paraspinals and the gluteus medius to work on tissue mobility Myofascial release: Tissue rolling of the abdomen to release the fascial restrictions Quadruped with pulling the tissue forward from back to the front to release the lateral trunk Using the suction cup to release the tissue on the back Neuromuscular re-education: Core retraining: Transverse abdominus contraction with ball squeeze and tactile cues to engage the abdomen correctly Form correction: Quadruped with hands in the suprapubic area moving back and forth to release the fascia then working on the diaphragm Down training: Educated patient on the urge to void to deter her urinary urgency Diaphragmatic breathing sitting on ball then did in quadruped and supine Self-care: Educated patient about the bladder  irritants and how they affect the bladder and gave her a list of them. She is going to try to not have irritants for 4 days to see if there is a difference.  Educated patient on the different treatment options that her and the doctor discussed that the doctor can offer.                                                                                                                             DATE: 03/19/24  EVAL Examination completed, findings reviewed, pt educated on POC, HEP, and female pelvic floor anatomy, reasoning with pelvic floor assessment internally with pt consent. Pt motivated to participate in PT and agreeable to attempt recommendations.     PATIENT EDUCATION:  03/19/24 Education details: Access Code: [email or web address] Person educated: Patient Education method: Explanation, Demonstration, Actor cues, Verbal cues, and  Handouts Education comprehension: verbalized understanding, returned demonstration, verbal cues required, tactile cues required, and needs further education  HOME EXERCISE PROGRAM: 03/19/24 Access Code: [email or web address] URL: [email or web address] Date: 03/19/2024 Prepared by: Channing Pereyra  Exercises - Supine Diaphragmatic Breathing  - 1 x daily - 7 x weekly - 1 sets - 10 reps - Seated Diaphragmatic Breathing  - 1 x daily - 7 x weekly - 1 sets - 10 reps  Patient Education - Urinary Urge Control Techniques       ASSESSMENT:  CLINICAL IMPRESSION: Patient is a 48 y.o. female who was seen today for physical therapy  treatment for Stress incontinence and overactive bladder. Pateint is waking up 1 time per night now.  She was able to perform diaphragmatic breathing and feel the pelvic floor relax by the end of the treatment. Patient understands how to contract the pelvic floor with activities. Patient understands keeping the distance from rib cage and pubic bone.  She was able to contract the pelvic floor contraction better after the session. She understands the urge to void for urinary urgency. Patient is independent with HEP.   OBJECTIVE IMPAIRMENTS: decreased coordination, decreased strength, increased fascial restrictions, and pain.   ACTIVITY LIMITATIONS: lifting, continence, and toileting  PARTICIPATION LIMITATIONS: shopping and community activity  PERSONAL FACTORS: Time since onset of injury/illness/exacerbation are also affecting patient's functional outcome.   REHAB POTENTIAL: Excellent  CLINICAL DECISION MAKING: Stable/uncomplicated  EVALUATION COMPLEXITY: Low   GOALS: Goals reviewed with patient? Yes  SHORT TERM GOALS: Target date: 04/16/24  Patient independent with diaphragmatic breathing to lengthen the pelvic floor.  Baseline:not educated yet Goal status: Met 04/02/24  2.  Patient understands how to sit on the commode to fully empty her bladder and not  have to push on her abdomen to urinate.  Baseline: not educated yet Goal status: Met 04/23/24  3.  Patient educated on the urgency drill to reduce the urge to void.  Baseline: not educated yet Goal status: Met 04/02/24   LONG TERM GOALS: Target date: 09/18/23  Patient independent with advanced HEP to reduce urinary urgency, increase pelvic  floor strength to reduce leakage.  Baseline: Not educated yet Goal status: Met 04/23/24  2.  Patient educated on ways to reduce pelvic floor pressure on her prolapse and the progression.   Baseline: not educated yet Goal status: Met 04/23/24  3.  Patient reports she wakes up 1 time per night due to the ability to deter the urge to urinate.  Baseline: wakes up 4 times Goal status: Met 04/23/24  4.  Patient is able to go to church with urinary urgency reduce greater than 75% so she is able to stand at the choir and not leave.  Baseline: has urgency when going to church.  Goal status: not met 04/23/24   PLAN: Discharge to HEP at this time.     Channing Pereyra, PT 04/23/24 10:35 AM  PHYSICAL THERAPY DISCHARGE SUMMARY  Visits from Start of Care: 2  Current functional level related to goals / functional outcomes: See above. Patient had to stop therapy due to her having to take care of her son.    Remaining deficits: See above.    Education / Equipment: HEP   Patient agrees to discharge. Patient goals were partially met. Patient is being discharged due to being pleased with the current functional level. Thank you for the referral.   Channing Pereyra, PT 04/23/24 10:35 AM

## 2024-05-05 LAB — GENECONNECT MOLECULAR SCREEN: Genetic Analysis Overall Interpretation: NEGATIVE

## 2024-05-06 ENCOUNTER — Encounter: Payer: Self-pay | Admitting: "Endocrinology

## 2024-05-06 ENCOUNTER — Ambulatory Visit: Admitting: "Endocrinology

## 2024-05-06 VITALS — BP 100/70 | HR 54 | Ht 60.0 in | Wt 170.0 lb

## 2024-05-06 DIAGNOSIS — E279 Disorder of adrenal gland, unspecified: Secondary | ICD-10-CM

## 2024-05-06 DIAGNOSIS — D3501 Benign neoplasm of right adrenal gland: Secondary | ICD-10-CM | POA: Diagnosis not present

## 2024-05-06 DIAGNOSIS — D3502 Benign neoplasm of left adrenal gland: Secondary | ICD-10-CM

## 2024-05-06 NOTE — Progress Notes (Signed)
 Outpatient Endocrinology Note Julie Birmingham, MD    Julie Boyd 04/08/1976 [id number]  Referring Provider: Purcell Emil Schanz, MD Primary Care Provider: Purcell Emil Schanz, MD Reason for consultation: Subjective   Assessment & Plan  Diagnoses and all orders for this visit:  Bilateral adrenal adenomas -     DHEA-sulfate -     ACTH -     Cortisol -     Metanephrines, plasma -     Aldosterone + renin activity w/ ratio  Adrenal nodule   Incidentally found bilateral adrenal nodules No moon facies/dorsocervical, No supraclavicular fat pad, No purple striae on the abdomen noted 11/15/23: CT ABDOMEN WITHOUT AND WITH CONTRAST: 16 mm left adrenal nodule with imaging characteristics compatible with a benign adenoma. Question of a small 9 mm nodule along the inferior limb of the right adrenal gland difficult to differentiate from adjacent structures, but also demonstrates imaging characteristics compatible with a benign adenoma. Ordered baseline 8 AM adrenal blood work  Return in about 23 days (around 05/29/2024) for next available visit and 8 am labs before next visit.   I have reviewed current medications, nurse's notes, allergies, vital signs, past medical and surgical history, family medical history, and social history for this encounter. Counseled patient on symptoms, examination findings, lab findings, imaging results, treatment decisions and monitoring and prognosis. The patient understood the recommendations and agrees with the treatment plan. All questions regarding treatment plan were fully answered.  Julie Birmingham, MD  05/06/2024   History of Present Illness HPI  Julie Boyd is a 48 y.o. female  referred by Dr. Sagardia for evaluation and management of adrenal nodule.    11/15/23: CT ABDOMEN WITHOUT AND WITH CONTRAST: 16 mm left adrenal nodule with imaging characteristics compatible with a benign adenoma. Question of a small 9 mm nodule along  the inferior limb of the right adrenal gland difficult to differentiate from adjacent structures, but also demonstrates imaging characteristics compatible with a benign adenoma.   She weight change Yes, gained 10 lbs in 2024 moon face No fat pads Yes increased girth Yes plethora No hyperpigmentation No purple striae No proximal muscle weakness No acne No vellus/terminal hirsutism Yes scalp hair loss Yes no history of HTN a history of hypokalemia No paroxysmal episodes of anxiety No tremors No (only if goes too long without eating) lightheadedness No headache No palpitation Yes sweating No blurry vision No  Physical Exam  BP 100/70   Pulse (!) 54   Ht 5' (1.524 m)   Wt 170 lb (77.1 kg)   SpO2 98%   BMI 33.20 kg/m    Constitutional: well developed, well nourished:  05/06/24: No moon facies/dorsocervical, No supraclavicular fat pad, No purple striae on the abdomen noted Head: normocephalic, atraumatic Eyes: sclera anicteric, no redness Neck: supple Lungs: normal respiratory effort Neurology: alert and oriented Skin: dry, no appreciable rashes Musculoskeletal: no appreciable defects Psychiatric: normal mood and affect   Current Medications Patient's Medications  New Prescriptions   No medications on file  Previous Medications   FIBER PO    Take by mouth.   SOLIFENACIN  (VESICARE ) 5 MG TABLET    Take 1 tablet (5 mg total) by mouth daily.   VALACYCLOVIR  (VALTREX ) 1000 MG TABLET    Take 2 tablets (2000 mg) po bid x 24 hours.  Modified Medications   No medications on file  Discontinued Medications   No medications on file    Allergies Allergies[1]  Past Medical History Past Medical History:  Diagnosis Date   Allergy    Elderly multigravida 11/29/2013   First trimester bleeding 11/29/2013   HSV-1 infection    Medical history non-contributory    Vaginal delivery 11/29/2013    Past Surgical History Past Surgical History:  Procedure Laterality Date    Polyp removal     uterine polyp    Family History family history includes Asthma in Boyd son; Cancer in Boyd maternal grandfather; Diabetes in Boyd father.  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   Born in [place]. Came to the [place]  in [place], at age 78.   Right    Working yes does yard work,    Social Drivers of Health   Tobacco Use: Medium Risk (05/06/2024)   Patient History    Smoking Tobacco Use: Never    Smokeless Tobacco Use: Never    Passive Exposure: Current  Financial Resource Strain: Not on file  Food Insecurity: Not on file  Transportation Needs: Not on file  Physical Activity: Not on file  Stress: Not on file  Social Connections: Not on file  Intimate Partner Violence: Not on file  Depression (PHQ2-9): Low Risk (10/02/2023)   Depression (PHQ2-9)    PHQ-2 Score: 0  Alcohol Screen: Not on file  Housing: Not on file  Utilities: Not on file  Health Literacy: Not on file    Lab Results  Component Value Date   CHOL 193 07/15/2023   Lab Results  Component Value Date   HDL 71.60 07/15/2023   Lab Results  Component Value Date   LDLCALC 89 07/15/2023   Lab Results  Component Value Date   TRIG 160.0 (H) 07/15/2023   Lab Results  Component Value Date   CHOLHDL  3 07/15/2023   Lab Results  Component Value Date   CREATININE 0.63 07/15/2023   Lab Results  Component Value Date   GFR 107.09 07/15/2023      Component Value Date/Time   NA 140 07/15/2023 0846   NA 138 05/20/2018 1132   K 4.2 07/15/2023 0846   CL 103 07/15/2023 0846   CO2 26 07/15/2023 0846   GLUCOSE 86 07/15/2023 0846   BUN 10 07/15/2023 0846   BUN 7 05/20/2018 1132   CREATININE 0.63 07/15/2023 0846   CREATININE 0.51 05/05/2015 1034   CALCIUM 9.4 07/15/2023 0846   PROT 7.2 07/15/2023 0846   PROT 6.4 05/20/2018 1132   ALBUMIN 4.5 07/15/2023 0846   ALBUMIN 4.2 05/20/2018 1132   AST 11 07/15/2023 0846   ALT 14 07/15/2023 0846   ALKPHOS 44 07/15/2023 0846   BILITOT 0.6 07/15/2023 0846   BILITOT 0.5 05/20/2018 1132   GFRNONAA 115 05/20/2018 1132  GFRAA 133 05/20/2018 1132      Latest Ref Rng & Units 07/15/2023    8:46 AM 07/12/2022   10:43 AM 07/11/2021    9:04 AM  BMP  Glucose 70 - 99 mg/dL 86  88  90   BUN 6 - 23 mg/dL 10  10  11    Creatinine 0.40 - 1.20 mg/dL 9.36  9.33  9.28   Sodium 135 - 145 mEq/L 140  139  138   Potassium 3.5 - 5.1 mEq/L 4.2  3.9  4.0   Chloride 96 - 112 mEq/L 103  104  105   CO2 19 - 32 mEq/L 26  28  28    Calcium 8.4 - 10.5 mg/dL 9.4  9.3  9.0        Component Value Date/Time   WBC 6.6 07/15/2023 0846   RBC 4.64 07/15/2023 0846   HGB 13.8 07/15/2023 0846   HGB 12.9 05/20/2018 1107   HCT 40.1 07/15/2023 0846   HCT 37.1 05/20/2018 1107   PLT 193.0 07/15/2023 0846   PLT 171 05/20/2018 1107   MCV 86.4 07/15/2023 0846   MCV 87 05/20/2018 1107   MCV 87.8 05/20/2018 1040   MCH 30.1 05/20/2018 1107   MCH 29.7 05/20/2018 1040   MCH 29.0 05/05/2015 1034   MCHC 34.5 07/15/2023 0846   RDW 13.1 07/15/2023 0846   RDW 12.7 05/20/2018 1107   LYMPHSABS 1.4 07/15/2023 0846   LYMPHSABS 1.3 05/20/2018 1107   MONOABS 0.4 07/15/2023 0846   EOSABS 0.1 07/15/2023 0846   EOSABS 0.1 05/20/2018 1107   BASOSABS 0.1 07/15/2023 0846   BASOSABS 0.1  05/20/2018 1107   Lab Results  Component Value Date   TSH 4.62 07/15/2023   TSH 3.45 07/12/2022   TSH 3.52 07/11/2021         Parts of this note may have been dictated using voice recognition software. There may be variances in spelling and vocabulary which are unintentional. Not all errors are proofread. Please notify the dino if any discrepancies are noted or if the meaning of any statement is not clear.      [1]  Allergies Allergen Reactions   Sulfa Antibiotics Rash   Aleve [Naproxen] Rash    Pt says she can take Ibuprofen  without a problem

## 2024-05-26 ENCOUNTER — Other Ambulatory Visit

## 2024-05-27 ENCOUNTER — Other Ambulatory Visit: Payer: Self-pay

## 2024-05-27 ENCOUNTER — Other Ambulatory Visit

## 2024-05-30 LAB — METANEPHRINES, PLASMA
Metanephrine, Free: <25 pg/mL
Normetanephrine, Free: 28 pg/mL
Total Metanephrines-Plasma: 28 pg/mL

## 2024-05-30 LAB — ALDOSTERONE + RENIN ACTIVITY W/ RATIO
ALDO / PRA Ratio: 5.8 ratio (ref 0.9–28.9)
Aldosterone: 6 ng/dL
Renin Activity: 1.04 ng/mL/h (ref 0.25–5.82)

## 2024-05-30 LAB — DHEA-SULFATE: DHEA-SO4: 149 ug/dL (ref 15–205)

## 2024-05-30 LAB — ACTH: C206 ACTH: 10 pg/mL (ref 6–50)

## 2024-05-30 LAB — CORTISOL: Cortisol, Plasma: 9.2 ug/dL

## 2024-06-01 ENCOUNTER — Ambulatory Visit: Admitting: Obstetrics and Gynecology

## 2024-06-01 ENCOUNTER — Encounter: Payer: Self-pay | Admitting: "Endocrinology

## 2024-06-01 ENCOUNTER — Ambulatory Visit (INDEPENDENT_AMBULATORY_CARE_PROVIDER_SITE_OTHER): Admitting: "Endocrinology

## 2024-06-01 ENCOUNTER — Encounter: Payer: Self-pay | Admitting: *Deleted

## 2024-06-01 VITALS — BP 110/80 | HR 78 | Ht 60.0 in | Wt 178.0 lb

## 2024-06-01 DIAGNOSIS — D3502 Benign neoplasm of left adrenal gland: Secondary | ICD-10-CM | POA: Diagnosis not present

## 2024-06-01 DIAGNOSIS — D3501 Benign neoplasm of right adrenal gland: Secondary | ICD-10-CM | POA: Diagnosis not present

## 2024-06-01 MED ORDER — DEXAMETHASONE 1 MG PO TABS: 1.0000 mg | ORAL_TABLET | Freq: Once | ORAL | 0 refills | Status: AC

## 2024-06-01 NOTE — Progress Notes (Signed)
 "    Outpatient Endocrinology Note Julie Birmingham, MD    Julie Boyd 1976-02-02 [id number]  Referring Provider: Purcell Emil Schanz, MD Primary Care Provider: Purcell Emil Schanz, MD Reason for consultation: Subjective   Assessment & Plan  There are no diagnoses linked to this encounter.  Incidentally found bilateral adrenal nodules No moon facies/dorsocervical, No supraclavicular fat pad, No purple striae on the abdomen noted 11/15/23: CT ABDOMEN WITHOUT AND WITH CONTRAST: 16 mm left adrenal nodule with imaging characteristics compatible with a benign adenoma. Question of a small 9 mm nodule along the inferior limb of the right adrenal gland difficult to differentiate from adjacent structures, but also demonstrates imaging characteristics compatible with a benign adenoma. Ordered baseline 8 AM adrenal blood work: WNL except cortisol came back at 9.2  Return in about 1 year (around 06/01/2025) for visit + labs before next visit.   I have reviewed current medications, nurse's notes, allergies, vital signs, past medical and surgical history, family medical history, and social history for this encounter. Counseled patient on symptoms, examination findings, lab findings, imaging results, treatment decisions and monitoring and prognosis. The patient understood the recommendations and agrees with the treatment plan. All questions regarding treatment plan were fully answered.  Julie Birmingham, MD  06/01/2024   History of Present Illness HPI  Julie Boyd is a 49 y.o. female  referred by Dr. Sagardia for evaluation and management of adrenal nodule.   11/15/23: CT ABDOMEN WITHOUT AND WITH CONTRAST: 16 mm left adrenal nodule with imaging characteristics compatible with a benign adenoma. Question of a small 9 mm nodule along the inferior limb of the right adrenal gland difficult to differentiate from adjacent structures, but also demonstrates imaging characteristics  compatible with a benign adenoma.  Reports normal energy No abdominal pain/nausea/vomiting/diarrhea Gained weight gain of 8 lbs - unintentional   Initial history:  She weight change Yes, gained 10 lbs in 2024 moon face No fat pads Yes increased girth Yes plethora No hyperpigmentation No purple striae No proximal muscle weakness No acne No vellus/terminal hirsutism Yes scalp hair loss Yes no history of HTN a history of hypokalemia No paroxysmal episodes of anxiety No tremors No (only if goes too long without eating) lightheadedness No headache No palpitation Yes sweating No blurry vision No  Physical Exam  BP 110/80   Pulse 78   Ht 5' (1.524 m)   Wt 178 lb (80.7 kg)   SpO2 98%   BMI 34.76 kg/m    Constitutional: well developed, well nourished:  05/06/24: No moon facies/dorsocervical, No supraclavicular fat pad, No purple striae on the abdomen noted Head: normocephalic, atraumatic Eyes: sclera anicteric, no redness Neck: supple Lungs: normal respiratory effort Neurology: alert and oriented Skin: dry, no appreciable rashes Musculoskeletal: no appreciable defects Psychiatric: normal mood and affect   Current Medications Patient's Medications  New Prescriptions   No medications on file  Previous Medications   FIBER PO    Take by mouth.   SOLIFENACIN  (VESICARE ) 5 MG TABLET    Take 1 tablet (5 mg total) by mouth daily.   VALACYCLOVIR  (VALTREX ) 1000 MG TABLET    Take 2 tablets (2000 mg) po bid x 24 hours.  Modified Medications   No medications on file  Discontinued Medications   No medications on file    Allergies Allergies[1]  Past Medical History Past Medical History:  Diagnosis Date   Allergy    Elderly multigravida 11/29/2013   First trimester bleeding 11/29/2013   HSV-1 infection  Medical history non-contributory    Vaginal delivery 11/29/2013    Past Surgical History Past Surgical History:  Procedure Laterality Date   Polyp removal      uterine polyp    Family History family history includes Asthma in her son; Cancer in her maternal grandfather; Diabetes in her father.  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   Born in [place]. Came to the [place]  in [place], at age 30.   Right    Working yes does yard work,    Social Drivers of Health   Tobacco Use: Medium Risk (06/01/2024)   Patient History    Smoking Tobacco Use: Never    Smokeless Tobacco Use: Never    Passive Exposure: Current  Financial Resource Strain: Not on file  Food Insecurity: Not on file  Transportation Needs: Not on file  Physical Activity: Not on file  Stress: Not on file  Social Connections: Not on file  Intimate Partner Violence: Not on file  Depression (PHQ2-9): Low Risk (10/02/2023)   Depression (PHQ2-9)    PHQ-2 Score: 0  Alcohol Screen: Not on file  Housing: Not on file  Utilities: Not on file  Health Literacy: Not on file    Lab Results  Component Value Date   CHOL 193 07/15/2023   Lab Results  Component Value Date   HDL 71.60 07/15/2023   Lab Results  Component Value Date   LDLCALC 89 07/15/2023   Lab Results  Component Value Date   TRIG 160.0 (H) 07/15/2023   Lab Results  Component Value Date   CHOLHDL 3 07/15/2023    Lab Results  Component Value Date   CREATININE 0.63 07/15/2023   Lab Results  Component Value Date   GFR 107.09 07/15/2023      Component Value Date/Time   NA 140 07/15/2023 0846   NA 138 05/20/2018 1132   K 4.2 07/15/2023 0846   CL 103 07/15/2023 0846   CO2 26 07/15/2023 0846   GLUCOSE 86 07/15/2023 0846   BUN 10 07/15/2023 0846   BUN 7 05/20/2018 1132   CREATININE 0.63 07/15/2023 0846   CREATININE 0.51 05/05/2015 1034   CALCIUM 9.4 07/15/2023 0846   PROT 7.2 07/15/2023 0846   PROT 6.4 05/20/2018 1132   ALBUMIN 4.5 07/15/2023 0846   ALBUMIN 4.2 05/20/2018 1132   AST 11 07/15/2023 0846   ALT 14 07/15/2023 0846   ALKPHOS 44 07/15/2023 0846   BILITOT 0.6 07/15/2023 0846   BILITOT 0.5 05/20/2018 1132   GFRNONAA 115 05/20/2018 1132   GFRAA 133 05/20/2018 1132      Latest Ref Rng & Units 07/15/2023    8:46 AM 07/12/2022   10:43  AM 07/11/2021    9:04 AM  BMP  Glucose 70 - 99 mg/dL 86  88  90   BUN 6 - 23 mg/dL 10  10  11    Creatinine 0.40 - 1.20 mg/dL 9.36  9.33  9.28   Sodium 135 - 145 mEq/L 140  139  138   Potassium 3.5 - 5.1 mEq/L 4.2  3.9  4.0   Chloride 96 - 112 mEq/L 103  104  105   CO2 19 - 32 mEq/L 26  28  28    Calcium 8.4 - 10.5 mg/dL 9.4  9.3  9.0        Component Value Date/Time   WBC 6.6 07/15/2023 0846   RBC 4.64 07/15/2023 0846   HGB 13.8 07/15/2023 0846   HGB 12.9 05/20/2018 1107   HCT 40.1 07/15/2023 0846   HCT 37.1 05/20/2018 1107   PLT 193.0 07/15/2023 0846   PLT 171 05/20/2018 1107   MCV 86.4 07/15/2023 0846   MCV 87 05/20/2018 1107   MCV 87.8 05/20/2018 1040   MCH 30.1 05/20/2018 1107   MCH 29.7 05/20/2018 1040   MCH 29.0 05/05/2015 1034   MCHC 34.5 07/15/2023 0846   RDW 13.1 07/15/2023 0846   RDW 12.7 05/20/2018 1107   LYMPHSABS 1.4 07/15/2023 0846   LYMPHSABS 1.3 05/20/2018 1107   MONOABS 0.4 07/15/2023 0846   EOSABS 0.1 07/15/2023 0846   EOSABS 0.1 05/20/2018 1107   BASOSABS 0.1 07/15/2023 0846   BASOSABS 0.1 05/20/2018 1107    Lab Results  Component Value Date   TSH 4.62 07/15/2023   TSH 3.45 07/12/2022   TSH 3.52 07/11/2021         Parts of this note may have been dictated using voice recognition software. There may be variances in spelling and vocabulary which are unintentional. Not all errors are proofread. Please notify the dino if any discrepancies are noted or if the meaning of any statement is not clear.       [1]  Allergies Allergen Reactions   Sulfa Antibiotics Rash   Aleve [Naproxen] Rash    Pt says she can take Ibuprofen  without a problem   "

## 2024-06-03 ENCOUNTER — Other Ambulatory Visit

## 2024-06-11 ENCOUNTER — Telehealth: Payer: Self-pay

## 2024-06-11 ENCOUNTER — Other Ambulatory Visit: Payer: Self-pay | Admitting: Obstetrics and Gynecology

## 2024-06-11 DIAGNOSIS — Z1231 Encounter for screening mammogram for malignant neoplasm of breast: Secondary | ICD-10-CM

## 2024-06-11 NOTE — Telephone Encounter (Signed)
 Pt called regarding lab results.

## 2024-06-12 LAB — DEXAMETHASONE, BLOOD: Dexamethasone, Serum: 265 ng/dL

## 2024-06-12 LAB — CORTISOL: Cortisol, Plasma: 0.9 ug/dL — ABNORMAL LOW

## 2024-06-17 ENCOUNTER — Encounter: Payer: Self-pay | Admitting: Gastroenterology

## 2024-06-17 ENCOUNTER — Ambulatory Visit: Admitting: Gastroenterology

## 2024-06-17 VITALS — BP 98/62 | HR 55 | Ht 60.5 in | Wt 179.0 lb

## 2024-06-17 DIAGNOSIS — L290 Pruritus ani: Secondary | ICD-10-CM | POA: Insufficient documentation

## 2024-06-17 DIAGNOSIS — K648 Other hemorrhoids: Secondary | ICD-10-CM | POA: Insufficient documentation

## 2024-06-17 DIAGNOSIS — K644 Residual hemorrhoidal skin tags: Secondary | ICD-10-CM

## 2024-06-17 NOTE — Progress Notes (Signed)
 "    06/17/2024 [name] [id number] June 05, 1975   Discussed the use of AI scribe software for clinical note transcription with the patient, who gave verbal consent to proceed.  History of Present Illness Julie Boyd is a 49 year old female with internal and external hemorrhoids who presents for evaluation of persistent perianal itching.  Persistent perianal itching began at the start of last year, initially thought maybe coinciding with a change in laundry detergent. Reverting to her previous detergent did not resolve symptoms. Itching is constant and severe, resulting in excoriation and broken skin, which leads to burning sensations. Symptoms are most pronounced after wiping following bowel movements, and scratching intensifies the sensation. She denies using her hands to scratch directly. No over-the-counter treatments, such as Preparation H or pinworm therapy, have been attempted. The itching significantly impacts her quality of life, particularly due to outdoor work and moisture exposure. Symptoms improved temporarily after colonoscopy, but recurred about one month ago.  A prior colonoscopy documented medium-sized internal and external hemorrhoids. Topical hydrocortisone cream provided temporary relief when applied daily, but efficacy diminished over time and she discontinued use. Sweating during landscaping work exacerbates symptoms. No rectal bleeding has occurred. Bowel movements are regular without straining, and she uses fiber supplementation.  She denies rectal bleeding.  Colonoscopy 12/2023 reviewed.  Past Medical History:  Diagnosis Date   Allergy    Elderly multigravida 11/29/2013   First trimester bleeding 11/29/2013   HSV-1 infection    Medical history non-contributory    Vaginal delivery 11/29/2013   x 3   Past Surgical History:  Procedure Laterality Date   Polyp removal  2010   uterine polyp    reports that she has never smoked. She has been  exposed to tobacco smoke. She has never used smokeless tobacco. She reports that she does not drink alcohol and does not use drugs. family history includes Asthma in her son; Cancer in her maternal grandfather; Diabetes in her father. Allergies[1]    Outpatient Encounter Medications as of 06/17/2024  Medication Sig   FIBER PO Take by mouth daily.   valACYclovir  (VALTREX ) 1000 MG tablet Take 2 tablets (2000 mg) po bid x 24 hours. (Patient taking differently: Take 1,000 mg by mouth every 12 (twelve) hours. Take 2 tablets (2000 mg) po bid x 24 hours.)   [DISCONTINUED] solifenacin  (VESICARE ) 5 MG tablet Take 1 tablet (5 mg total) by mouth daily.   No facility-administered encounter medications on file as of 06/17/2024.     REVIEW OF SYSTEMS  : All other systems reviewed and negative except where noted in the History of Present Illness.   PHYSICAL EXAM: BP 98/62   Pulse (!) 55   Ht 5' 0.5 (1.537 m)   Wt 179 lb (81.2 kg)   LMP 06/12/2024 (Exact Date)   SpO2 98%   BMI 34.38 kg/m  General: Well developed female in no acute distress Head: Normocephalic and atraumatic Eyes:  Sclerae anicteric, conjunctiva pink. Ears: Normal auditory acuity Rectal:  No external abnormalities seen other than a single, very small external hemorrhoid. Musculoskeletal: Symmetrical with no gross deformities  Skin: No lesions on visible extremities Extremities: No edema  Neurological: Alert oriented x 4, grossly non-focal Psychological:  Alert and cooperative. Normal mood and affect  Assessment & Plan Pruritus ani Chronic, severe, and persistent perianal pruritus with excoriation and burning, likely multifactorial in etiology with possible contributions from moisture, overcleaning, and less likely pinworm infection; not clearly attributable to hemorrhoids or external irritation. - Advised  maintaining perianal hygiene by keeping the area clean and dry, avoiding overcleaning and scratching. - Recommended cold  compresses for symptomatic relief of pruritus. - Initiated zinc oxide cream (Desitin) twice daily as a moisture barrier, especially before work and at bedtime. - Recommended trial of over-the-counter Preparation H for symptomatic relief.  Internal hemorrhoids Medium-sized internal hemorrhoids identified on prior colonoscopy, currently without rectal bleeding but possibly contributing to symptoms; benefit of banding is uncertain. - Scheduled internal hemorrhoid banding procedure with Dr. Shila at patient request. - Provided education regarding the banding procedure.   External hemorrhoids Single, very small external hemorrhoid without erythema or significant inflammation; unlikely to be the primary etiology of symptoms.  Consider pinworm infection Pinworm infection considered due to occupational exposure to soil, though less common in adults; empiric treatment reasonable given low risk and potential benefit. - Recommended over-the-counter pinworm treatment per package instructions (initial dose and repeat in one week).   CC:  Julie Emil Schanz, MD       [1]  Allergies Allergen Reactions   Sulfa Antibiotics Rash   Aleve [Naproxen] Rash    Pt says she can take Ibuprofen  without a problem   "

## 2024-06-17 NOTE — Patient Instructions (Addendum)
 May use course of OTC Pinworm treatment.   May use Preparation H or Zinc Oxide ([place]) as needed.  _______________________________________________________  If your blood pressure at your visit was 140/90 or greater, please contact your primary care physician to follow up on this.  _______________________________________________________  If you are age 47 or older, your body mass index should be between 23-30. Your Body mass index is 34.38 kg/m. If this is out of the aforementioned range listed, please consider follow up with your Primary Care Provider.  If you are age 38 or younger, your body mass index should be between 19-25. Your Body mass index is 34.38 kg/m. If this is out of the aformentioned range listed, please consider follow up with your Primary Care Provider.   ________________________________________________________  The [facility] providers would like to encourage you to use MYCHART to communicate with providers for non-urgent requests or questions.  Due to long hold times on the telephone, sending your provider a message by [facility] may be a faster and more efficient way to get a response.  Please allow 48 business hours for a response.  Please remember that this is for non-urgent requests.  _______________________________________________________  [facility] is using a team-based approach to care.  Your team is made up of your doctor and two to three APPS. Our APPS (Nurse Practitioners and Physician Assistants) work with your physician to ensure care continuity for you. They are fully qualified to address your health concerns and develop a treatment plan. They communicate directly with your gastroenterologist to care for you. Seeing the Advanced Practice Practitioners on your physician's team can help you by facilitating care more promptly, often allowing for earlier appointments, access to diagnostic testing, procedures, and other specialty referrals.

## 2024-06-19 ENCOUNTER — Ambulatory Visit: Payer: Self-pay | Admitting: "Endocrinology

## 2024-06-24 ENCOUNTER — Encounter: Payer: Self-pay | Admitting: Gastroenterology

## 2024-06-24 ENCOUNTER — Ambulatory Visit: Admitting: Gastroenterology

## 2024-06-24 VITALS — BP 114/70 | HR 79 | Ht 60.5 in | Wt 176.0 lb

## 2024-06-24 DIAGNOSIS — K641 Second degree hemorrhoids: Secondary | ICD-10-CM

## 2024-06-24 NOTE — Progress Notes (Unsigned)
 PROCEDURE NOTE: The patient presents with symptomatic grade II  hemorrhoids, requesting rubber band ligation of his/her hemorrhoidal disease.  All risks, benefits and alternative forms of therapy were described and informed consent was obtained.  In the Left Lateral Decubitus position anoscopic examination revealed grade II hemorrhoids in the left lateral, right posterior position(s).  The anorectum was pre-medicated with RectiCare The decision was made to band the left lateral internal hemorrhoid, and the [facility] ORegan System was used to perform band ligation without complication.  Digital anorectal examination was then performed to assure proper positioning of the band, and to adjust the banded tissue as required.  The patient was discharged home without pain or other issues.  Dietary and behavioral recommendations were given and along with follow-up instructions.       The patient will return in 4 to 6 weeks for  follow-up and possible additional banding as required. No complications were encountered and the patient tolerated the procedure well.   LOIS Wilkie Mcgee , MD [phone]

## 2024-06-24 NOTE — Patient Instructions (Signed)

## 2024-06-25 ENCOUNTER — Ambulatory Visit
Admission: RE | Admit: 2024-06-25 | Discharge: 2024-06-25 | Disposition: A | Source: Ambulatory Visit | Attending: Obstetrics and Gynecology | Admitting: Obstetrics and Gynecology

## 2024-06-25 DIAGNOSIS — Z1231 Encounter for screening mammogram for malignant neoplasm of breast: Secondary | ICD-10-CM

## 2024-06-26 ENCOUNTER — Encounter: Payer: Self-pay | Admitting: Gastroenterology

## 2024-06-30 ENCOUNTER — Ambulatory Visit: Admitting: "Endocrinology

## 2024-07-16 ENCOUNTER — Encounter: Payer: Medicaid Other | Admitting: Emergency Medicine

## 2024-07-20 ENCOUNTER — Encounter: Admitting: Emergency Medicine

## 2024-08-07 ENCOUNTER — Encounter: Admitting: Gastroenterology

## 2024-09-30 ENCOUNTER — Ambulatory Visit: Admitting: Obstetrics and Gynecology

## 2025-05-31 ENCOUNTER — Ambulatory Visit: Admitting: "Endocrinology
